# Patient Record
Sex: Female | Born: 1975 | Race: Black or African American | Hispanic: No | Marital: Single
Health system: Southern US, Community
[De-identification: ages and names within clinical notes are randomized; demographics above are authoritative.]

## PROBLEM LIST (undated history)

## (undated) ENCOUNTER — Encounter

## (undated) ENCOUNTER — Ambulatory Visit: Attending: Family | Primary: Family

## (undated) ENCOUNTER — Ambulatory Visit

## (undated) ENCOUNTER — Telehealth: Attending: Family | Primary: Family

## (undated) ENCOUNTER — Ambulatory Visit: Payer: PRIVATE HEALTH INSURANCE | Attending: Family | Primary: Family

## (undated) ENCOUNTER — Ambulatory Visit: Payer: PRIVATE HEALTH INSURANCE

## (undated) ENCOUNTER — Encounter: Attending: Family | Primary: Family

## (undated) ENCOUNTER — Telehealth

## (undated) ENCOUNTER — Ambulatory Visit
Payer: PRIVATE HEALTH INSURANCE | Attending: Physical Medicine & Rehabilitation | Primary: Physical Medicine & Rehabilitation

## (undated) ENCOUNTER — Institutional Professional Consult (permissible substitution)

## (undated) ENCOUNTER — Ambulatory Visit: Payer: PRIVATE HEALTH INSURANCE | Attending: Physician Assistant | Primary: Physician Assistant

## (undated) ENCOUNTER — Ambulatory Visit
Payer: MEDICARE | Attending: Student in an Organized Health Care Education/Training Program | Primary: Student in an Organized Health Care Education/Training Program

## (undated) ENCOUNTER — Inpatient Hospital Stay: Payer: PRIVATE HEALTH INSURANCE

## (undated) ENCOUNTER — Ambulatory Visit: Payer: MEDICAID

## (undated) ENCOUNTER — Encounter: Attending: Infectious Disease | Primary: Infectious Disease

## (undated) ENCOUNTER — Ambulatory Visit: Payer: PRIVATE HEALTH INSURANCE | Attending: Orthopaedic Surgery | Primary: Orthopaedic Surgery

## (undated) ENCOUNTER — Ambulatory Visit
Payer: PRIVATE HEALTH INSURANCE | Attending: Student in an Organized Health Care Education/Training Program | Primary: Student in an Organized Health Care Education/Training Program

## (undated) HISTORY — PX: CHOLECYSTECTOMY: SHX55

## (undated) MED ORDER — ZINC GLUCONATE 50 MG TABLET: Freq: Every day | ORAL | 0 days

## (undated) MED ORDER — ERGOCALCIFEROL (VITAMIN D2) 1,250 MCG (50,000 UNIT) CAPSULE: ORAL | 0 days

## (undated) MED ORDER — ELDERBERRY FRUIT 460 MG-ELDERBERRY FLOWER 115 MG CAPSULE: ORAL | 0 days

## (undated) MED ORDER — VITAMIN D3 ORAL: ORAL | 0 days

## (undated) MED ORDER — CALCIUM CARBONATE 600 MG CALCIUM (1,500 MG) TABLET: Freq: Every day | ORAL | 0 days

## (undated) MED ORDER — ASCORBIC ACID (VITAMIN C) 1,000 MG TABLET: Freq: Every day | ORAL | 0 days

---
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
## 2017-04-12 ENCOUNTER — Ambulatory Visit
Admission: RE | Admit: 2017-04-12 | Discharge: 2017-04-12 | Payer: Commercial Managed Care - PPO | Attending: Specialist | Admitting: Specialist

## 2017-04-12 DIAGNOSIS — Z202 Contact with and (suspected) exposure to infections with a predominantly sexual mode of transmission: Secondary | ICD-10-CM

## 2017-04-12 DIAGNOSIS — R87618 Other abnormal cytological findings on specimens from cervix uteri: Secondary | ICD-10-CM

## 2017-04-12 DIAGNOSIS — Z6841 Body Mass Index (BMI) 40.0 and over, adult: Principal | ICD-10-CM

## 2018-05-25 MED ORDER — ESCITALOPRAM 20 MG TABLET
ORAL_TABLET | Freq: Every day | ORAL | 0 refills | 0.00000 days | Status: CP
Start: 2018-05-25 — End: 2018-11-24

## 2018-11-24 MED ORDER — ESCITALOPRAM 20 MG TABLET
ORAL_TABLET | 0 refills | 0 days | Status: CP
Start: 2018-11-24 — End: 2018-12-07

## 2018-12-07 MED ORDER — ESCITALOPRAM 20 MG TABLET
ORAL_TABLET | Freq: Every day | ORAL | 1 refills | 0.00000 days | Status: CP
Start: 2018-12-07 — End: ?

## 2018-12-07 MED ORDER — TRAZODONE 50 MG TABLET
ORAL_TABLET | Freq: Every evening | ORAL | 5 refills | 0.00000 days | Status: CP
Start: 2018-12-07 — End: 2019-01-06

## 2018-12-07 MED ORDER — ALPRAZOLAM 0.25 MG TABLET
ORAL_TABLET | Freq: Three times a day (TID) | ORAL | 1 refills | 0.00000 days | Status: CP | PRN
Start: 2018-12-07 — End: 2019-12-07

## 2018-12-12 ENCOUNTER — Ambulatory Visit: Admit: 2018-12-12 | Discharge: 2018-12-13

## 2018-12-12 DIAGNOSIS — N3000 Acute cystitis without hematuria: Principal | ICD-10-CM

## 2018-12-14 MED ORDER — SULFAMETHOXAZOLE 800 MG-TRIMETHOPRIM 160 MG TABLET
ORAL_TABLET | Freq: Two times a day (BID) | ORAL | 0 refills | 0 days | Status: CP
Start: 2018-12-14 — End: 2018-12-19

## 2019-01-25 ENCOUNTER — Emergency Department
Admission: EM | Admit: 2019-01-25 | Discharge: 2019-01-26 | Disposition: A | Discharge status: Home / Self Care | Payer: Self-pay | Attending: Student in an Organized Health Care Education/Training Program | Admitting: Student in an Organized Health Care Education/Training Program

## 2019-01-25 ENCOUNTER — Emergency Department: Payer: Self-pay

## 2019-01-25 ENCOUNTER — Other Ambulatory Visit: Payer: Self-pay

## 2019-01-25 DIAGNOSIS — R002 Palpitations: Secondary | ICD-10-CM | POA: Insufficient documentation

## 2019-01-25 DIAGNOSIS — R000 Tachycardia, unspecified: Secondary | ICD-10-CM | POA: Insufficient documentation

## 2019-01-25 LAB — COMPREHENSIVE METABOLIC PANEL
ALT: 19 U/L (ref 0–44)
AST: 25 U/L (ref 15–41)
Albumin: 3.8 g/dL (ref 3.5–5.0)
Alkaline Phosphatase: 45 U/L (ref 38–126)
Anion gap: 8 (ref 5–15)
BUN: 12 mg/dL (ref 6–20)
CO2: 23 mmol/L (ref 22–32)
Calcium: 8.3 mg/dL — ABNORMAL LOW (ref 8.9–10.3)
Chloride: 106 mmol/L (ref 98–111)
Creatinine, Ser: 0.76 mg/dL (ref 0.44–1.00)
GFR calc Af Amer: >60 mL/min (ref >60)
GFR calc non Af Amer: >60 mL/min (ref >60)
Glucose, Bld: 133 mg/dL — ABNORMAL HIGH (ref 70–99)
Potassium: 3.1 mmol/L — ABNORMAL LOW (ref 3.5–5.1)
Sodium: 137 mmol/L (ref 135–145)
Total Bilirubin: 0.5 mg/dL (ref 0.3–1.2)
Total Protein: 7 g/dL (ref 6.5–8.1)

## 2019-01-25 LAB — CBC WITH DIFFERENTIAL/PLATELET
Abs Immature Granulocytes: 0.03 10*3/uL (ref 0.00–0.07)
Basophils Absolute: 0 10*3/uL (ref 0.0–0.1)
Basophils Relative: 0 %
Eosinophils Absolute: 0.2 10*3/uL (ref 0.0–0.5)
Eosinophils Relative: 2 %
HCT: 35.2 % — ABNORMAL LOW (ref 36.0–46.0)
Hemoglobin: 11.3 g/dL — ABNORMAL LOW (ref 12.0–15.0)
Immature Granulocytes: 0 %
Lymphocytes Relative: 42 %
Lymphs Abs: 3.1 10*3/uL (ref 0.7–4.0)
MCH: 25.9 pg — ABNORMAL LOW (ref 26.0–34.0)
MCHC: 32.1 g/dL (ref 30.0–36.0)
MCV: 80.5 fL (ref 80.0–100.0)
Monocytes Absolute: 0.7 10*3/uL (ref 0.1–1.0)
Monocytes Relative: 10 %
Neutro Abs: 3.4 10*3/uL (ref 1.7–7.7)
Neutrophils Relative %: 46 %
Platelets: 256 10*3/uL (ref 150–400)
RBC: 4.37 MIL/uL (ref 3.87–5.11)
RDW: 14.3 % (ref 11.5–15.5)
WBC: 7.4 10*3/uL (ref 4.0–10.5)
nRBC: 0 % (ref 0.0–0.2)

## 2019-01-25 LAB — FIBRIN DERIVATIVES D-DIMER (ARMC ONLY): Fibrin derivatives D-dimer (ARMC): 330.41 ng{FEU}/mL (ref 0.00–499.00)

## 2019-01-25 MED ORDER — LORAZEPAM 1 MG PO TABS
1.0000 mg | ORAL_TABLET | Freq: Once | ORAL | Status: AC
  Administered 2019-01-25: 23:00:00 1 mg via ORAL
  Filled 2019-01-25: qty 1

## 2019-01-25 NOTE — ED Triage Notes (Signed)
Pt arrives to ED via [facility] with c/c of chest tightness and tachycardia. Pt took "small amount" of CBD oil and felt the Sx onset shortly after. 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 EMS reports initial vitals of p165, 123/83, 98% on room air. Pt given 6mg  adenosine with subsequent pulse of 120. 20G place in left AC. Pt given 566mL NS bolus. Upon arrival, pt A&Ox4, pt anxious, no respiratory Sx evident. Dr Quentin Cornwall at bedside.

## 2019-01-25 NOTE — ED Provider Notes (Signed)
[facility] Emergency Department Provider Note    First MD Initiated Contact with Patient 01/25/19 2310     (approximate)  I have reviewed the triage vital signs and the nursing notes.   HISTORY  Chief Complaint Tachycardia    HPI Stacy Stevens is a 43 y.o. female   no significant past medical history presents the ER for evaluation of "SVT".  Patient states that she was trying CBD oil droplet this evening for the first time and immediately after started feeling chest palpitations associated with shortness of breath.  States that she immediately knew that she was" SVT.  "However she is never been diagnosed with SVT.  Has had Holter monitors done before.  Denies any recent prolonged travel.  No history of blood clots.  Denies any history of coronary disease.  No history of hypertension or thyroid disease.  States that she still feels very jittery and anxious.  She was given adenosine in route however review of the tracings appeared to show a sinus tachycardia.   History reviewed. No pertinent past medical history. No family history on file. Past Surgical History:  Procedure Laterality Date  . CHOLECYSTECTOMY     There are no active problems to display for this patient.     Prior to Admission medications   Not on File    Allergies Patient has no allergy information on record.    Social History Social History   Tobacco Use  . Smoking status: Not on file  Substance Use Topics  . Alcohol use: Not on file  . Drug use: Not on file    Review of Systems Patient denies headaches, rhinorrhea, blurry vision, numbness, shortness of breath, chest pain, edema, cough, abdominal pain, nausea, vomiting, diarrhea, dysuria, fevers, rashes or hallucinations unless otherwise stated above in HPI. ____________________________________________   PHYSICAL EXAM:  VITAL SIGNS: Vitals:   01/26/19 0130 01/26/19 0145  BP: 109/69   Pulse: 92 95  Resp:    SpO2: 97%  99%    Constitutional: Alert and oriented. anxious  Eyes: Conjunctivae are normal.  Head: Atraumatic. Nose: No congestion/rhinnorhea. Mouth/Throat: Mucous membranes are moist.   Neck: No stridor. Painless ROM.  Cardiovascular: Normal rate, regular rhythm. Grossly normal heart sounds.  Good peripheral circulation. Respiratory: Normal respiratory effort.  No retractions. Lungs CTAB. Gastrointestinal: Soft and nontender. No distention. No abdominal bruits. No CVA tenderness. Genitourinary: deferred Musculoskeletal: No lower extremity tenderness nor edema.  No joint effusions. Neurologic:  Normal speech and language. No gross focal neurologic deficits are appreciated. No facial droop Skin:  Skin is warm, dry and intact. No rash noted. Psychiatric: Mood and affect are normal. Speech and behavior are normal.  ____________________________________________   LABS (all labs ordered are listed, but only abnormal results are displayed)  Results for orders placed or performed during the hospital encounter of 01/25/19 (from the past 24 hour(s))  CBC with Differential/Platelet     Status: Abnormal   Collection Time: 01/25/19 11:19 PM  Result Value Ref Range   WBC 7.4 4.0 - 10.5 K/uL   RBC 4.37 3.87 - 5.11 MIL/uL   Hemoglobin 11.3 (L) 12.0 - 15.0 g/dL   HCT 35.2 (L) 36.0 - 46.0 %   MCV 80.5 80.0 - 100.0 fL   MCH 25.9 (L) 26.0 - 34.0 pg   MCHC 32.1 30.0 - 36.0 g/dL   RDW 14.3 11.5 - 15.5 %   Platelets 256 150 - 400 K/uL   nRBC 0.0 0.0 - 0.2 %  Neutrophils Relative % 46 %   Neutro Abs 3.4 1.7 - 7.7 K/uL   Lymphocytes Relative 42 %   Lymphs Abs 3.1 0.7 - 4.0 K/uL   Monocytes Relative 10 %   Monocytes Absolute 0.7 0.1 - 1.0 K/uL   Eosinophils Relative 2 %   Eosinophils Absolute 0.2 0.0 - 0.5 K/uL   Basophils Relative 0 %   Basophils Absolute 0.0 0.0 - 0.1 K/uL   Immature Granulocytes 0 %   Abs Immature Granulocytes 0.03 0.00 - 0.07 K/uL  Comprehensive metabolic panel     Status:  Abnormal   Collection Time: 01/25/19 11:19 PM  Result Value Ref Range   Sodium 137 135 - 145 mmol/L   Potassium 3.1 (L) 3.5 - 5.1 mmol/L   Chloride 106 98 - 111 mmol/L   CO2 23 22 - 32 mmol/L   Glucose, Bld 133 (H) 70 - 99 mg/dL   BUN 12 6 - 20 mg/dL   Creatinine, Ser 9.600.76 0.44 - 1.00 mg/dL   Calcium 8.3 (L) 8.9 - 10.3 mg/dL   Total Protein 7.0 6.5 - 8.1 g/dL   Albumin 3.8 3.5 - 5.0 g/dL   AST 25 15 - 41 U/L   ALT 19 0 - 44 U/L   Alkaline Phosphatase 45 38 - 126 U/L   Total Bilirubin 0.5 0.3 - 1.2 mg/dL   GFR calc non Af Amer >60 >60 mL/min   GFR calc Af Amer >60 >60 mL/min   Anion gap 8 5 - 15  Troponin I (High Sensitivity)     Status: None   Collection Time: 01/25/19 11:19 PM  Result Value Ref Range   Troponin I (High Sensitivity) 3 <18 ng/L  Fibrin derivatives D-Dimer ([facility] only)     Status: None   Collection Time: 01/25/19 11:19 PM  Result Value Ref Range   Fibrin derivatives D-dimer ([facility]) 330.41 0.00 - 499.00 ng/mL (FEU)  Troponin I (High Sensitivity)     Status: None   Collection Time: 01/26/19  1:23 AM  Result Value Ref Range   Troponin I (High Sensitivity) 3 <18 ng/L   ____________________________________________  EKG My review and personal interpretation at Time: 21:38   Indication: palpiations  Rate: 105  Rhythm: sinus Axis: normal Other: normal intervals, no stemi, nonspecific st and t wave abnml ____________________________________________  RADIOLOGY  I personally reviewed all radiographic images ordered to evaluate for the above acute complaints and reviewed radiology reports and findings.  These findings were personally discussed with the patient.  Please see medical record for radiology report.  ____________________________________________   PROCEDURES  Procedure(s) performed:  Procedures    Critical Care performed: no ____________________________________________   INITIAL IMPRESSION / ASSESSMENT AND PLAN / ED COURSE  Pertinent labs &  imaging results that were available during my care of the patient were reviewed by me and considered in my medical decision making (see chart for details).   DDX: Dysrhythmia, ACS, PE, dehydration, medication effect, anxiety, panic attack, electrolyte abnormality  Stacy Stevens is a 43 y.o. who presents to the ED with symptoms as described above.  She is anxious appearing but in no acute distress.  I have low suspicion for ACS but will evaluate with serial enzymes.  She is low risk by Wells criteria will order d-dimer to further stratify for PE.  No signs of dysrhythmia on twelve-lead.  EMS strip shows sinus tachycardia.  Will place on cardiac monitor observe in the ER.  Clinical Course as of Jan 25 201  Fri Jan 26, 2019  0156 Repeat troponin is negative.  Patient remains stable and appropriate for outpatient follow-up.  Suspect symptoms are related to ingestion of CBD oil inducing what sounds like a panic attack.  Have discussed with the patient and available family all diagnostics and treatments performed thus far and all questions were answered to the best of my ability. The patient demonstrates understanding and agreement with plan.    [PR]    Clinical Course User Index [PR] Willy Eddyobinson, Kaelea Gathright, MD    The patient was evaluated in Emergency Department today for the symptoms described in the history of present illness. He/she was evaluated in the context of the global COVID-19 pandemic, which necessitated consideration that the patient might be at risk for infection with the SARS-CoV-2 virus that causes COVID-19. Institutional protocols and algorithms that pertain to the evaluation of patients at risk for COVID-19 are in a state of rapid change based on information released by regulatory bodies including the CDC and federal and state organizations. These policies and algorithms were followed during the patient's care in the ED.  As part of my medical decision making, I reviewed the following data  within the electronic MEDICAL RECORD NUMBER Nursing notes reviewed and incorporated, Labs reviewed, notes from prior ED visits and [place] Controlled Substance Database   ____________________________________________   FINAL CLINICAL IMPRESSION(S) / ED DIAGNOSES  Final diagnoses:  Tachycardia  Palpitations      NEW MEDICATIONS STARTED DURING THIS VISIT:  New Prescriptions   No medications on file     Note:  This document was prepared using Dragon voice recognition software and may include unintentional dictation errors.    Willy Eddyobinson, Kye Silverstein, MD 01/26/19 [phone]

## 2019-01-26 LAB — TROPONIN I (HIGH SENSITIVITY)
Troponin I (High Sensitivity): 3 ng/L (ref <18)
Troponin I (High Sensitivity): 3 ng/L (ref <18)

## 2019-01-26 NOTE — ED Notes (Signed)
Pt states CP dec since arrival; pt asleep upon entrance to room; family remains with pt.

## 2019-03-29 DIAGNOSIS — F419 Anxiety disorder, unspecified: Secondary | ICD-10-CM

## 2019-06-21 MED ORDER — ESCITALOPRAM 20 MG TABLET
ORAL_TABLET | Freq: Every day | ORAL | 1 refills | 90.00000 days | Status: CP
Start: 2019-06-21 — End: ?

## 2019-06-21 MED ORDER — ALPRAZOLAM 0.25 MG TABLET
ORAL_TABLET | Freq: Three times a day (TID) | ORAL | 0 refills | 5 days | Status: CP | PRN
Start: 2019-06-21 — End: 2020-06-20

## 2019-06-26 MED ORDER — FLUOXETINE 40 MG CAPSULE
ORAL_CAPSULE | Freq: Every day | ORAL | 2 refills | 30.00000 days | Status: CP
Start: 2019-06-26 — End: ?

## 2019-10-19 ENCOUNTER — Ambulatory Visit
Admit: 2019-10-19 | Discharge: 2019-10-20 | Disposition: A | Discharge status: Home / Self Care | Payer: MEDICAID | Attending: Emergency Medicine

## 2019-10-19 DIAGNOSIS — R079 Chest pain, unspecified: Principal | ICD-10-CM

## 2019-10-19 MED ORDER — PANTOPRAZOLE 20 MG TABLET,DELAYED RELEASE
ORAL_TABLET | Freq: Every day | ORAL | 3 refills | 90 days | Status: CP
Start: 2019-10-19 — End: 2019-11-08

## 2019-10-19 MED ORDER — SUCRALFATE 100 MG/ML ORAL SUSPENSION
Freq: Four times a day (QID) | ORAL | 0 refills | 11.00000 days | Status: CP
Start: 2019-10-19 — End: 2019-11-18

## 2019-11-03 DIAGNOSIS — F419 Anxiety disorder, unspecified: Principal | ICD-10-CM

## 2019-11-06 MED ORDER — SUCRALFATE 1 GRAM TABLET
ORAL_TABLET | Freq: Four times a day (QID) | ORAL | 1 refills | 30 days | Status: CP
Start: 2019-11-06 — End: 2020-11-05

## 2020-07-03 DIAGNOSIS — F419 Anxiety disorder, unspecified: Principal | ICD-10-CM

## 2020-07-03 MED ORDER — FLUOXETINE 40 MG CAPSULE
ORAL_CAPSULE | 0 refills | 0 days | Status: CP
Start: 2020-07-03 — End: ?

## 2020-08-07 IMAGING — DX PORTABLE CHEST - 1 VIEW
1 series · 1 of 1 positions shown · non-contrast
Comparison: None

CLINICAL DATA: Shortness of breath, chest pressure

EXAM:
PORTABLE CHEST 1 VIEW

[chest ap]
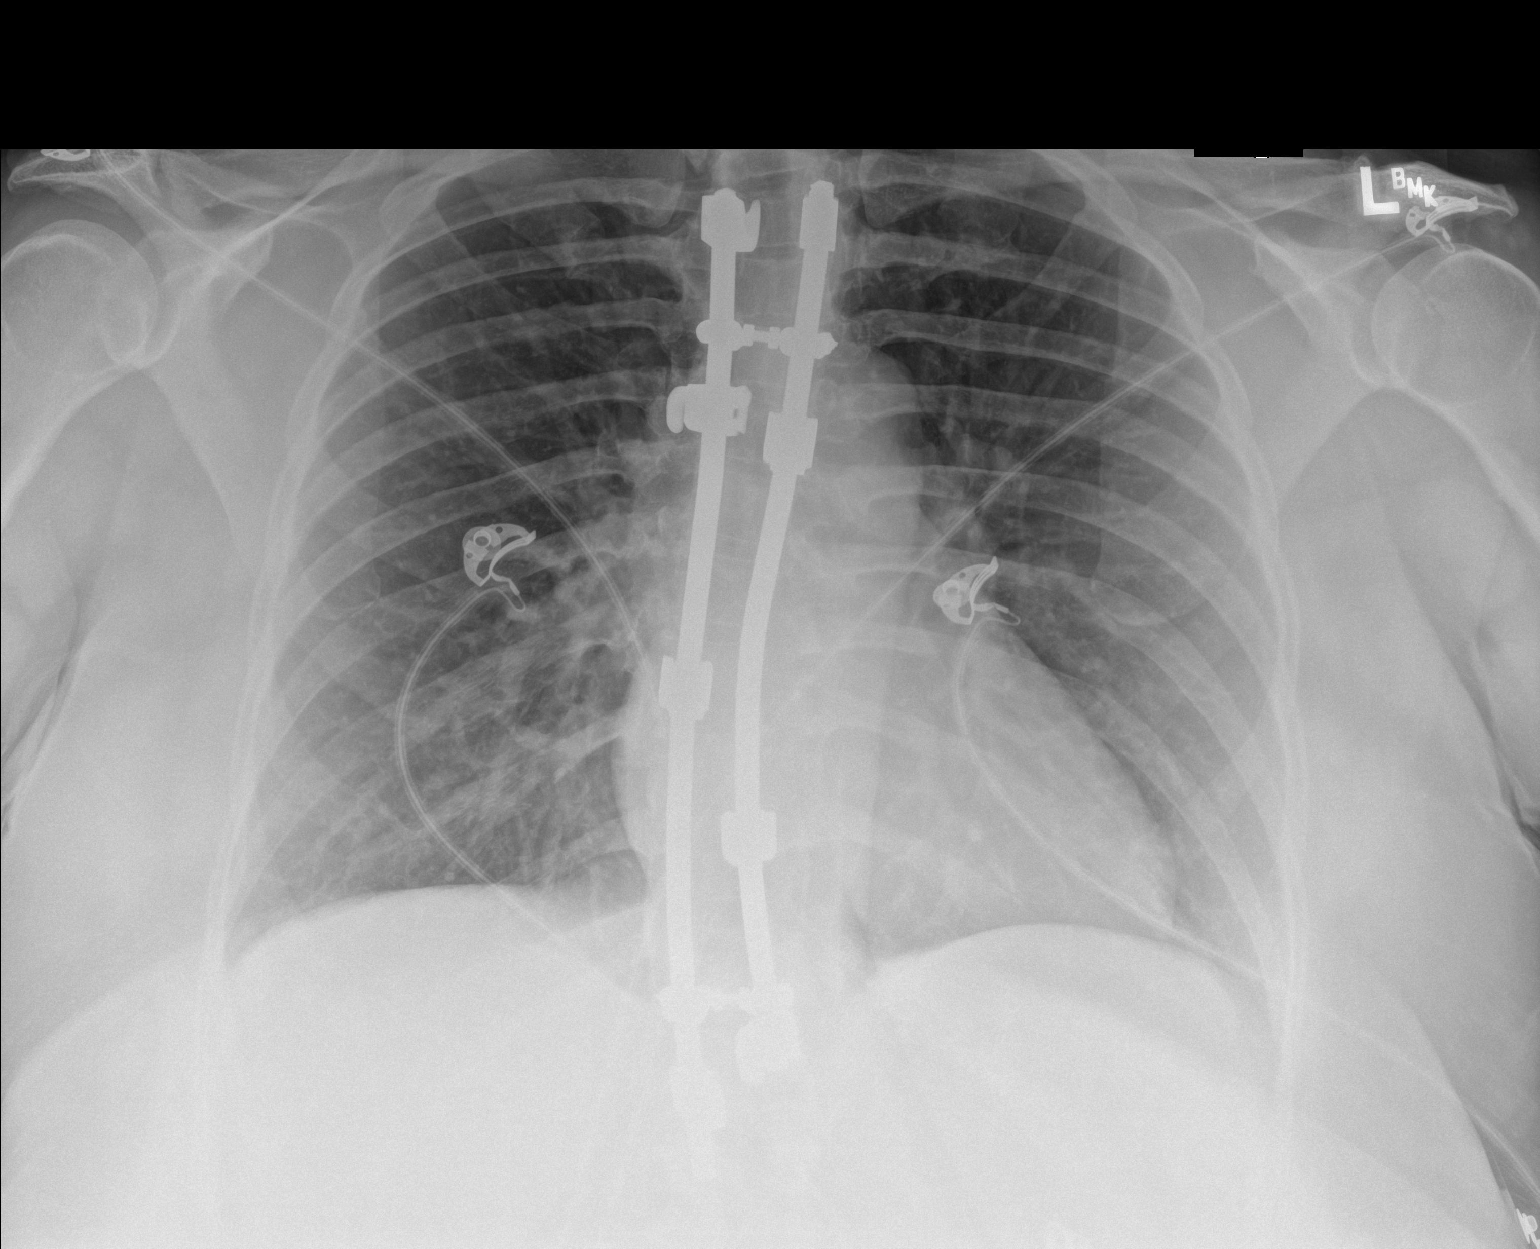

[1 of 1 positions shown; findings below may reference images not displayed]

FINDINGS: Heart and mediastinal contours are within normal limits. No focal
opacities or effusions. No acute bony abnormality. Posterior spinal
rods in place with mild right scoliosis.
IMPRESSION: No active cardiopulmonary disease.

## 2020-10-24 ENCOUNTER — Ambulatory Visit: Admit: 2020-10-24 | Discharge: 2020-10-25

## 2020-10-24 ENCOUNTER — Ambulatory Visit: Admit: 2020-10-24 | Discharge: 2020-10-25 | Attending: Family | Primary: Family

## 2020-10-24 DIAGNOSIS — R590 Localized enlarged lymph nodes: Principal | ICD-10-CM

## 2020-10-24 DIAGNOSIS — Z717 Human immunodeficiency virus [HIV] counseling: Principal | ICD-10-CM

## 2020-10-24 DIAGNOSIS — B20 Human immunodeficiency virus [HIV] disease: Principal | ICD-10-CM

## 2020-10-24 DIAGNOSIS — Z21 Asymptomatic human immunodeficiency virus [HIV] infection status: Principal | ICD-10-CM

## 2020-10-24 MED ORDER — BICTEGRAVIR 50 MG-EMTRICITABINE 200 MG-TENOFOVIR ALAFENAM 25 MG TABLET
ORAL_TABLET | Freq: Every day | ORAL | 11 refills | 30.00000 days | Status: CP
Start: 2020-10-24 — End: 2021-10-24
  Filled 2020-12-04: qty 30, 30d supply, fill #0

## 2020-10-28 ENCOUNTER — Encounter: Admit: 2020-10-28 | Discharge: 2020-10-28

## 2020-10-30 ENCOUNTER — Ambulatory Visit: Admit: 2020-10-30 | Discharge: 2020-10-31

## 2020-10-30 DIAGNOSIS — Z21 Asymptomatic human immunodeficiency virus [HIV] infection status: Principal | ICD-10-CM

## 2020-10-30 DIAGNOSIS — R590 Localized enlarged lymph nodes: Principal | ICD-10-CM

## 2020-10-30 DIAGNOSIS — B20 Human immunodeficiency virus [HIV] disease: Principal | ICD-10-CM

## 2020-10-30 DIAGNOSIS — Z717 Human immunodeficiency virus [HIV] counseling: Principal | ICD-10-CM

## 2020-11-24 ENCOUNTER — Ambulatory Visit: Admit: 2020-11-24 | Discharge: 2020-11-25

## 2020-11-24 ENCOUNTER — Ambulatory Visit: Admit: 2020-11-24 | Discharge: 2020-11-25 | Attending: Family | Primary: Family

## 2020-11-24 DIAGNOSIS — Z21 Asymptomatic human immunodeficiency virus [HIV] infection status: Principal | ICD-10-CM

## 2020-11-24 DIAGNOSIS — Z79899 Other long term (current) drug therapy: Principal | ICD-10-CM

## 2020-11-24 DIAGNOSIS — Z717 Human immunodeficiency virus [HIV] counseling: Principal | ICD-10-CM

## 2020-11-24 DIAGNOSIS — B259 Cytomegaloviral disease, unspecified: Principal | ICD-10-CM

## 2020-11-24 DIAGNOSIS — B20 Human immunodeficiency virus [HIV] disease: Principal | ICD-10-CM

## 2020-11-24 MED ORDER — BICTEGRAVIR 50 MG-EMTRICITABINE 200 MG-TENOFOVIR ALAFENAM 25 MG TABLET
ORAL_TABLET | Freq: Every day | ORAL | 0 refills | 14 days
Start: 2020-11-24 — End: 2020-12-08

## 2020-12-04 NOTE — Unmapped (Signed)
[facility] Specialty Medication Onboarding    Specialty Medication: Radio producer  Prior Authorization: Not Required   Financial Assistance: Yes - manufacture assistance approved via copay card or RX [employer]UGS (1102 bucket) as primary   Final Copay/Day Supply: $0 / 30    Insurance Restrictions: None     Notes to Pharmacist: n/a    The triage team has completed the benefits investigation and has determined that the patient is able to fill this medication at [facility]. Please contact the patient to complete the onboarding or follow up with the prescribing physician as needed.

## 2020-12-04 NOTE — Unmapped (Addendum)
[facility]   Patient Onboarding/Medication Counseling    Shelley Beck is a 45 y.o. female with HIV who I am counseling today on continuation of therapy.  I am speaking to the patient.    Was a Nurse, learning disability used for this call? No    Verified patient's date of birth / HIPAA.    Specialty medication(s) to be sent: Infectious Disease: Biktarvy      Non-specialty medications/supplies to be sent: n/a      Medications not needed at this time: n/a         Biktarvy (bictegravir, emtricitabine, and tenofovir alafenamide)    The patient declined counseling on medication administration, missed dose instructions, goals of therapy, side effects and monitoring parameters, warnings and precautions, drug/food interactions and storage, handling precautions, and disposal because they have taken the medication previously. The information in the declined sections below are for informational purposes only and was not discussed with patient.       Medication & Administration     Dosage: Take 1 tablet by mouth daily    Administration: Take without regard to food    Adherence/Missed dose instructions: take missed dose as soon as you remember. If it is close to the time of your next dose, skip the dose and resume with your next scheduled dose.    Goals of Therapy     To suppress viral replication and keep patient's HIV undetectable by lab tests    Side Effects & Monitoring Parameters     Common Side Effects:  ??? Diarrhea  ??? Upset stomach  ??? Headache  ??? Changes in Weight  ??? Changes in mood    The following side effects should be reported to the provider:     ?? If patient experiences: signs of an allergic reaction (rash; hives; itching; red, swollen, blistered, or peeling skin with or without fever; wheezing; tightness in the chest or throat; trouble breathing, swallowing, or talking; unusual hoarseness; or swelling of the mouth, face, lips, tongue, or throat)  ?? signs of kidney problems (unable to pass urine, change in how much urine is passed, blood in the urine, or a big weight gain)  ?? signs of liver problems (dark urine, feeling tired, not hungry, upset stomach or stomach pain, light-colored stools, throwing up, or yellow skin or eyes)  ?? signs of lactic acidosis (fast breathing, fast heartbeat, a heartbeat that does not feel normal, very bad upset stomach or throwing up, feeling very sleepy, shortness of breath, feeling very tired or weak, very bad dizziness, feeling cold, or muscle pain or cramps)  Weight gain: some patients have reported weight gain after starting this medication. The amount of weight can vary.    Monitoring Parameters:  ?? CD4  Count  ?? HIV RNA plasma levels,  ?? Liver function  ?? Total bilirubin  ?? serum creatinine  ?? urine glucose  ?? urine protein (prior to or when initiating therapy and as clinically indicated during therapy);       Drug/Food Interactions     ??? Medication list reviewed in Epic. The patient was instructed to inform the care team before taking any new medications or supplements.   ??? Drug interaction(s).    o Metformin:  Biktarvy increases levels of metformin. Monitor for increased side effects from metfomin and for symptoms of lactic acidosis such as fast breathing, fast heartbeat, a heartbeat that does not feel normal, very bad upset stomach or throwing up, feeling very sleepy, shortness of breath,  feeling very tired or weak, very bad dizziness, feeling cold, or muscle pain or cramps. She has been taking both metformin and Biktarvy with no adverse effects.  o Calcium and zinc: I told her to either take at the same time with Biktarvy with food or to take the Biktarvy 2 hours before or 6 hours after these supplements  ??? Iron Preparations: May decrease the serum concentration of Biktarvy. If taken with food, Biktarvy can be administered with Ferrous sulfate. If taken on an empty stomach, Biktarvy must be taken 2 hours before ferrous sulfate. Avoid other iron salts.    Contraindications, Warnings, & Precautions     ??? Black Box Warning: Severe acute exacertbations of HBV have been reported in patients coinfected with HIV-1 and HBV fllowing discontinuation of therapy  ??? Coadministration with dofetilide, rifampin is contraindicated  ??? Immune reconstitution syndrome: Patients may develop immune reconstitution syndrome, resulting in the occurrence of an inflammatory response to an indolent or residual opportunistic infection or activation of autoimmune disorders (eg, Graves disease, polymyositis, Guillain-Barr?? syndrome, autoimmune hepatitis)   ??? Lactic acidosis/hepatomegaly  ??? Renal toxicity: patients with preexisting renal impairment and those taking nephrotoxic agents (including NSAIDs) are at increased risk.     Storage, Handling Precautions, & Disposal     ??? Store in the original container at room temperature.   ??? Keep lid tightly closed.   ??? Store in a dry place. Do not store in a bathroom.   ??? Keep all drugs in a safe place. Keep all drugs out of the reach of children and pets.   ??? Throw away unused or expired drugs. Do not flush down a toilet or pour down a drain unless you are told to do so. Check with your pharmacist if you have questions about the best way to throw out drugs. There may be drug take-back programs in your area.        Current Medications (including OTC/herbals), Comorbidities and Allergies     Current Outpatient Medications   Medication Sig Dispense Refill   ??? ascorbic acid, vitamin C, (VITAMIN C) 1000 MG tablet Take 2,000 mg by mouth in the morning.     ??? bictegrav-emtricit-tenofov ala (BIKTARVY) 50-200-25 mg tablet Take 1 tablet by mouth daily. 30 tablet 11   ??? bictegrav-emtricit-tenofov ala (BIKTARVY) 50-200-25 mg tablet Take 1 tablet by mouth in the morning for 14 days. 14 tablet 0   ??? calcium carbonate (OS-CAL) 1,500 mg (600 mg elem calcium) tablet Take 1 tablet by mouth in the morning.     ??? cholecalciferol, vitamin D3, (VITAMIN D3 ORAL) Take by mouth.     ??? elderberry fruit and flower 460-115 mg cap Take by mouth.     ??? ergocalciferol-1,250 mcg, 50,000 unit, (VITAMIN D2-1,250 MCG, 50,000 UNIT,) 1,250 mcg (50,000 unit) capsule Take 1,250 mcg by mouth once a week.     ??? escitalopram oxalate (LEXAPRO) 20 MG tablet Take 1 tablet (20 mg total) by mouth daily. 90 tablet 1   ??? fluticasone (FLONASE) 50 mcg/actuation nasal spray 2 sprays by Each Nare route daily. USE FOR 10-12 DAYS 16 g 0   ??? hydrOXYzine (ATARAX) 25 MG tablet TAKE 1 TO 2 TABLETS BY MOUTH THREE TIMES DAILY AS NEEDED FOR ANXIETY     ??? metFORMIN (GLUCOPHAGE-XR) 750 MG 24 hr tablet TAKE 1 TABLET BY MOUTH IN THE MORNING     ??? ondansetron (ZOFRAN-ODT) 4 MG disintegrating tablet PLACE 1 TABLET ON THE TONGUE AND ALLOW TO DISSOLVE EVERY  8 HOURS AS NEEDED     ??? phentermine 37.5 MG capsule TAKE 1 CAPSULE BY MOUTH IN THE MORNING     ??? traZODone (DESYREL) 50 MG tablet Take 1 tablet (50 mg total) by mouth nightly. 30 tablet 5   ??? zinc gluconate 50 mg (7 mg elemental zinc) tablet Take 50 mg by mouth in the morning.       No current facility-administered medications for this visit.       No Known Allergies    Patient Active Problem List   Diagnosis   ??? Morbid (severe) obesity due to excess calories (CMS-HCC)   ??? Primary insomnia   ??? Anxiety       Reviewed and up to date in Epic.    Appropriateness of Therapy     Acute infections noted within Epic:  No active infections  Patient reported infection: None    Is medication and dose appropriate based on diagnosis and infection status? Yes    Prescription has been clinically reviewed: Yes      Baseline Quality of Life Assessment      How many days over the past month did your HIV  keep you from your normal activities? For example, brushing your teeth or getting up in the morning. 0    Financial Information     Medication Assistance provided: Manufacturer Assistance    Anticipated copay of $0.00 reviewed with patient. Verified delivery address.    Delivery Information     Scheduled delivery date: 12/05/20    Expected start date: continuation of current treatment    Medication will be delivered via Next Day Courier to the prescription address in [facility].  This shipment will not require a signature.      Explained the services we provide at [facility] and that each month we would call to set up refills.  Stressed importance of returning phone calls so that we could ensure they receive their medications in time each month.  Informed patient that we should be setting up refills 7-10 days prior to when they will run out of medication.  A pharmacist will reach out to perform a clinical assessment periodically.  Informed patient that a welcome packet, containing information about our pharmacy and other support services, a Notice of Privacy Practices, and a drug information handout will be sent.      The patient or caregiver noted above participated in the development of this care plan and knows that they can request review of or adjustments to the care plan at any time.      Patient or caregiver verbalized understanding of the above information as well as how to contact the pharmacy at [phone] option 4 with any questions/concerns.  The pharmacy is open Monday through Friday 8:30am-4:30pm.  A pharmacist is available 24/7 via pager to answer any clinical questions they may have.    Patient Specific Needs     - Does the patient have any physical, cognitive, or cultural barriers? No    - Does the patient have adequate living arrangements? (i.e. the ability to store and take their medication appropriately) Yes    - Did you identify any home environmental safety or security hazards? No    - Patient prefers to have medications discussed with  Patient     - Is the patient or caregiver able to read and understand education materials at a high school level or above? Yes    - Patient's primary language is  English     - Is the patient high risk? No    - Does the patient require physician intervention or other additional services (i.e. dietary/nutrition, smoking cessation, social work)? No      Roderic Palau  [facility] Specialty Pharmacist

## 2021-01-13 NOTE — Unmapped (Signed)
The [facility] has made a third and final attempt to reach this patient to refill the following medication: Biktarvy.      We have been unable to leave messages on the following phone numbers: [phone] and have sent a MyChart message.    Dates contacted: 7/14, 7/20, 7/26  Last scheduled delivery: shipped 6/16    The patient may be at risk of non-compliance with this medication. The patient should call the [facility] at [phone] (option 4) to refill medication.    Shelley Beck   [facility] Specialty Technician

## 2021-01-15 NOTE — Unmapped (Signed)
I have been able to reach pt this pm. I have provided her with the contact # for [facility]. She reports she will call this pm.

## 2021-01-16 NOTE — Unmapped (Signed)
[facility] Refill Coordination Note    Specialty Medication(s) to be Shipped:   Infectious Disease: Biktarvy    Other medication(s) to be shipped: No additional medications requested for fill at this time     Shelley Beck, DOB: 06-18-1976  Phone: [phone] (home)       All above HIPAA information was verified with patient.     Was a Nurse, learning disability used for this call? No    Completed refill call assessment today to schedule patient's medication shipment from the [facility] [phone]).  All relevant notes have been reviewed.     Specialty medication(s) and dose(s) confirmed: Regimen is correct and unchanged.   Changes to medications: Amie reports no changes at this time.  Changes to insurance: No  New side effects reported not previously addressed with a pharmacist or physician: None reported  Questions for the pharmacist: No    Confirmed patient received a Conservation officer, historic buildings and a Surveyor, mining with first shipment. The patient will receive a drug information handout for each medication shipped and additional FDA Medication Guides as required.       DISEASE/MEDICATION-SPECIFIC INFORMATION        N/A    SPECIALTY MEDICATION ADHERENCE     Medication Adherence    Patient reported X missed doses in the last month: 0  Specialty Medication: BIKTARVY  Patient is on additional specialty medications: No              Were doses missed due to medication being on hold? No    Biktarvy 5 days worth of medication on hand.        REFERRAL TO PHARMACIST     Referral to the pharmacist: Not needed      [facility]     Shipping address confirmed in Epic.     Delivery Scheduled: Yes, Expected medication delivery date: 01/19/21.     Medication will be delivered via Same Day Courier to the prescription address in Epic W[facility].    [name]   [facility] Specialty Technician

## 2021-01-19 MED FILL — BIKTARVY 50 MG-200 MG-25 MG TABLET: ORAL | 30 days supply | Qty: 30 | Fill #1

## 2021-02-24 NOTE — Unmapped (Signed)
The [facility] has made a third and final attempt to reach this patient to refill the following medication: Biktarvy.      We have been unable to leave messages on the following phone numbers: [phone] and [phone] and have sent a MyChart message.    Dates contacted: 8/24, 8/30, 9/6  Last scheduled delivery: shipped 8/1    The patient may be at risk of non-compliance with this medication. The patient should call the [facility] at [phone]  Option 4, then Option 2 (all other specialty patients) to refill medication.    [name]   [facility] Specialty Technician

## 2021-02-24 NOTE — Unmapped (Signed)
I have called and left message for Oneta Rack- Mother [phone] (ROI) - to contact [facility] and ask for Selena Batten.

## 2021-02-25 NOTE — Unmapped (Signed)
I have called and left message for return call on number provided.

## 2021-02-25 NOTE — Unmapped (Signed)
I have received a call back from pt's mother. Pt has a different phone number [phone] - pt does work nights, may have to leave a message.

## 2021-02-26 NOTE — Unmapped (Signed)
I have called and left message for clarification of the number [phone]. I am unsure at this time if it is the mother, Shelley Beck or the patient, Shelley Beck.

## 2021-02-27 ENCOUNTER — Institutional Professional Consult (permissible substitution): Admit: 2021-02-27 | Discharge: 2021-02-28 | Attending: Family | Primary: Family

## 2021-02-27 DIAGNOSIS — B20 Human immunodeficiency virus [HIV] disease: Principal | ICD-10-CM

## 2021-02-27 NOTE — Unmapped (Signed)
[facility] Refill Coordination Note    Specialty Medication(s) to be Shipped:   Infectious Disease: Biktarvy    Other medication(s) to be shipped: No additional medications requested for fill at this time     Shelley Beck, DOB: 24-Apr-1976  Phone: [phone] (home)       All above HIPAA information was verified with patient.     Was a Nurse, learning disability used for this call? No    Completed refill call assessment today to schedule patient's medication shipment from the [facility] [phone]).  All relevant notes have been reviewed.     Specialty medication(s) and dose(s) confirmed: Regimen is correct and unchanged.   Changes to medications: Shelley Beck reports no changes at this time.  Changes to insurance: No  New side effects reported not previously addressed with a pharmacist or physician: None reported  Questions for the pharmacist: No    Confirmed patient received a Conservation officer, historic buildings and a Surveyor, mining with first shipment. The patient will receive a drug information handout for each medication shipped and additional FDA Medication Guides as required.       DISEASE/MEDICATION-SPECIFIC INFORMATION        N/A    SPECIALTY MEDICATION ADHERENCE     Medication Adherence    Patient reported X missed doses in the last month: 0  Specialty Medication: biktarvy  Patient is on additional specialty medications: No  Patient is on more than two specialty medications: No  Any gaps in refill history greater than 2 weeks in the last 3 months: no  Demonstrates understanding of importance of adherence: yes  Informant: patient              Were doses missed due to medication being on hold? No    Biktarvy 50-200-25mg : Patient has 3 days of medication on hand     REFERRAL TO PHARMACIST     Referral to the pharmacist: Not needed      [facility]     Shipping address confirmed in Epic.     Delivery Scheduled: Yes, Expected medication delivery date: 9/12.     Medication will be delivered via Same Day Courier to the prescription address in Epic WAM.    Olga Millers   [facility] Specialty Technician

## 2021-02-27 NOTE — Unmapped (Signed)
Patient picked up sample medication

## 2021-02-27 NOTE — Unmapped (Signed)
Discussed HIV biology, medication side effects, compliance, resistance, safe sex with condoms.  7 days Biktarvy samples provided.

## 2021-03-02 MED FILL — BIKTARVY 50 MG-200 MG-25 MG TABLET: ORAL | 30 days supply | Qty: 30 | Fill #2

## 2021-03-25 NOTE — Unmapped (Signed)
LM ON VM TO RETURN CALL TO RESCHEDULE.

## 2021-03-25 NOTE — Unmapped (Signed)
[facility] Refill Coordination Note    Specialty Medication(s) to be Shipped:   Infectious Disease: Biktarvy    Other medication(s) to be shipped: No additional medications requested for fill at this time     Shelley Beck, DOB: 1975-10-13  Phone: [phone] (home)       All above HIPAA information was verified with patient.     Was a Nurse, learning disability used for this call? No    Completed refill call assessment today to schedule patient's medication shipment from the [facility] [phone]).  All relevant notes have been reviewed.     Specialty medication(s) and dose(s) confirmed: Regimen is correct and unchanged.   Changes to medications: Shelley Beck reports no changes at this time.  Changes to insurance: No  New side effects reported not previously addressed with a pharmacist or physician: None reported  Questions for the pharmacist: No    Confirmed patient received a Conservation officer, historic buildings and a Surveyor, mining with first shipment. The patient will receive a drug information handout for each medication shipped and additional FDA Medication Guides as required.       DISEASE/MEDICATION-SPECIFIC INFORMATION        N/A    SPECIALTY MEDICATION ADHERENCE     Medication Adherence    Patient reported X missed doses in the last month: 0  Specialty Medication: BIKTARVY  Patient is on additional specialty medications: No              Were doses missed due to medication being on hold? No    Biktarvy 50-200-25 mg: 7 days of medicine on hand        REFERRAL TO PHARMACIST     Referral to the pharmacist: Not needed      [name]     Shipping address confirmed in Epic.     Delivery Scheduled: Yes, Expected medication delivery date: 03/31/21.     Medication will be delivered via Next Day Courier to the prescription address in Epic WAM.    [name]   [facility] Specialty Technician

## 2021-03-30 MED FILL — BIKTARVY 50 MG-200 MG-25 MG TABLET: ORAL | 30 days supply | Qty: 30 | Fill #3

## 2021-04-27 NOTE — Unmapped (Signed)
[facility] Clinical Assessment & Refill Coordination Note    Shelley Beck, [place]: Mar 14, 1976  Phone: [phone] (home)     All above HIPAA information was verified with patient's family member, Shelley Beck (domestic partner).     Was a Nurse, learning disability used for this call? No    Specialty Medication(s):   Infectious Disease: Biktarvy     Current Outpatient Medications   Medication Sig Dispense Refill   ??? ascorbic acid, vitamin C, (VITAMIN C) 1000 MG tablet Take 2,000 mg by mouth in the morning.     ??? bictegrav-emtricit-tenofov ala (BIKTARVY) 50-200-25 mg tablet Take 1 tablet by mouth daily. 30 tablet 11   ??? calcium carbonate (OS-CAL) 1,500 mg (600 mg elem calcium) tablet Take 1 tablet by mouth in the morning.     ??? cholecalciferol, vitamin D3, (VITAMIN D3 ORAL) Take by mouth.     ??? elderberry fruit and flower 460-115 mg cap Take by mouth.     ??? ergocalciferol-1,250 mcg, 50,000 unit, (VITAMIN D2-1,250 MCG, 50,000 UNIT,) 1,250 mcg (50,000 unit) capsule Take 1,250 mcg by mouth once a week.     ??? escitalopram oxalate (LEXAPRO) 20 MG tablet Take 1 tablet (20 mg total) by mouth daily. 90 tablet 1   ??? fluticasone (FLONASE) 50 mcg/actuation nasal spray 2 sprays by Each Nare route daily. USE FOR 10-12 DAYS 16 g 0   ??? hydrOXYzine (ATARAX) 25 MG tablet TAKE 1 TO 2 TABLETS BY MOUTH THREE TIMES DAILY AS NEEDED FOR ANXIETY     ??? metFORMIN (GLUCOPHAGE-XR) 750 MG 24 hr tablet TAKE 1 TABLET BY MOUTH IN THE MORNING     ??? ondansetron (ZOFRAN-ODT) 4 MG disintegrating tablet PLACE 1 TABLET ON THE TONGUE AND ALLOW TO DISSOLVE EVERY 8 HOURS AS NEEDED     ??? phentermine 37.5 MG capsule TAKE 1 CAPSULE BY MOUTH IN THE MORNING     ??? traZODone (DESYREL) 50 MG tablet Take 1 tablet (50 mg total) by mouth nightly. 30 tablet 5   ??? zinc gluconate 50 mg (7 mg elemental zinc) tablet Take 50 mg by mouth in the morning.       No current facility-administered medications for this visit.        Changes to medications: no changes    No Known Allergies    Changes to allergies: No    SPECIALTY MEDICATION ADHERENCE     Biktarvy 50-200-+25 mg: approximately 7 days of medicine on hand       Medication Adherence    Patient reported X missed doses in the last month: 0  Specialty Medication: Biktarvy 50-200-25mg   Patient is on additional specialty medications: No  Any gaps in refill history greater than 2 weeks in the last 3 months: no  Demonstrates understanding of importance of adherence: yes  Informant: significant other  Reliability of informant: reliable  Provider-estimated medication adherence level: good  Patient is at risk for Non-Adherence: No          Specialty medication(s) dose(s) confirmed: Regimen is correct and unchanged.     Are there any concerns with adherence? No    Adherence counseling provided? Not needed    CLINICAL MANAGEMENT AND INTERVENTION      Clinical Benefit Assessment:    Do you feel the medicine is effective or helping your condition? Yes    HIV ASSOCIATED LABS:     Lab Results   Component Value Date/Time    HIVRS Detected (A) 11/24/2020 12:22 PM  HIVRS Detected (A) 10/24/2020 11:17 AM    HIVCP 709 (H) 11/24/2020 12:22 PM    HIVCP 657,846 (H) 10/24/2020 11:17 AM    ACD4 525 11/24/2020 12:22 PM    ACD4 480 (L) 10/30/2020 02:06 PM       Clinical Benefit counseling provided? Labs from 11/24/20 show evidence of clinical benefit    Adverse Effects Assessment:    Are you experiencing any side effects? No    Are you experiencing difficulty administering your medicine? No    Quality of Life Assessment:    How many days over the past month did your HIV  keep you from your normal activities? For example, brushing your teeth or getting up in the morning. 0    Have you discussed this with your provider? Not needed    Acute Infection Status:    Acute infections noted within Epic:  No active infections  Patient reported infection: None    Therapy Appropriateness:    Is therapy appropriate and patient progressing towards therapeutic goals? Yes, therapy is appropriate and should be continued    DISEASE/MEDICATION-SPECIFIC INFORMATION      N/A    PATIENT SPECIFIC NEEDS     - Does the patient have any physical, cognitive, or cultural barriers? No    - Is the patient high risk? No    - Does the patient require a Care Management Plan? No     - Does the patient require physician intervention or other additional services (i.e. nutrition, smoking cessation, social work)? No      SHIPPING     Specialty Medication(s) to be Shipped:   Infectious Disease: Biktarvy    Other medication(s) to be shipped: No additional medications requested for fill at this time     Changes to insurance: No    Delivery Scheduled: Yes, Expected medication delivery date: 04/28/21.     Medication will be delivered via Same Day Courier to the confirmed prescription address in [facility].    The patient will receive a drug information handout for each medication shipped and additional FDA Medication Guides as required.  Verified that patient has previously received a Conservation officer, historic buildings and a Surveyor, mining.    The patient or caregiver noted above participated in the development of this care plan and knows that they can request review of or adjustments to the care plan at any time.      All of the patient's questions and concerns have been addressed.    Roderic Palau   [facility] Specialty Pharmacist

## 2021-04-28 MED FILL — BIKTARVY 50 MG-200 MG-25 MG TABLET: ORAL | 30 days supply | Qty: 30 | Fill #4

## 2021-05-27 NOTE — Unmapped (Signed)
[facility] Pharm[facility] Refill Coordination Note    Specialty Medication(s) to be Shipped:   Infectious Disease: Biktarvy    Other medication(s) to be shipped: No additional medications requested for fill at this time     Shelley Beck, DOB: 08-25-1975  Phone: [phone] (home)       All above HIPAA information was verified with patient's family member, finley.     Was a Nurse, learning disability used for this call? No    Completed refill call assessment today to schedule patient's medication shipment from the [facility] [phone]).  All relevant notes have been reviewed.     Specialty medication(s) and dose(s) confirmed: Regimen is correct and unchanged.   Changes to medications: Yahaira reports no changes at this time.  Changes to insurance: No  New side effects reported not previously addressed with a pharmacist or physician: None reported  Questions for the pharmacist: No    Confirmed patient received a Conservation officer, historic buildings and a Surveyor, mining with first shipment. The patient will receive a drug information handout for each medication shipped and additional FDA Medication Guides as required.       DISEASE/MEDICATION-SPECIFIC INFORMATION        N/A    SPECIALTY MEDICATION ADHERENCE     Medication Adherence    Patient reported X missed doses in the last month: 0  Specialty Medication: BIKTARVY              Were doses missed due to medication being on hold? No    Unable to confirm quantity on hand    REFERRAL TO PHARMACIST     Referral to the pharmacist: Not needed      [facility]     Shipping address confirmed in Epic.     Delivery Scheduled: Yes, Expected medication delivery date: 12/9.     Medication will be delivered via Next Day Courier to the prescription address in Epic WAM.    [name]   [facility] Specialty Technician

## 2021-05-28 MED FILL — BIKTARVY 50 MG-200 MG-25 MG TABLET: ORAL | 30 days supply | Qty: 30 | Fill #5

## 2021-07-02 NOTE — Unmapped (Signed)
The [facility] has made a second and final attempt to reach this patient to refill the following medication: Biktarvy.      We have left voicemail with mom at the following phone numbers: [phone], have been unable to leave messages on the following phone numbers: [phone]  and [phone] and have sent a MyChart message.    Dates contacted: 12/30, 1/11  Last scheduled delivery: shipped 12/8      The patient may be at risk of non-compliance with this medication. The patient should call the [facility] at [phone]  Option 4, then Option 2 (all other specialty patients) to refill medication.    [name]   [facility] Specialty Technician

## 2021-07-10 DIAGNOSIS — B349 Viral infection, unspecified: Principal | ICD-10-CM

## 2021-07-10 DIAGNOSIS — R0602 Shortness of breath: Principal | ICD-10-CM

## 2021-07-10 MED ORDER — ALBUTEROL SULFATE HFA 90 MCG/ACTUATION AEROSOL INHALER
RESPIRATORY_TRACT | 0 refills | 0 days | Status: CP | PRN
Start: 2021-07-10 — End: 2022-07-10

## 2021-07-11 ENCOUNTER — Ambulatory Visit: Admit: 2021-07-11 | Discharge: 2021-07-11 | Disposition: A | Discharge status: Home / Self Care | Payer: MEDICARE

## 2021-07-11 ENCOUNTER — Emergency Department: Admit: 2021-07-11 | Discharge: 2021-07-11 | Disposition: A | Discharge status: Home / Self Care | Payer: MEDICARE

## 2021-07-11 NOTE — Unmapped (Signed)
Emergency Department   ED Provider Note  Room: FAM/FAM    History     Chief Complaint:  Shortness of Breath     History provided by patient    HPI:  Keirah Konitzer is a 46 y.o. female who presents with a chief complaint of shortness of breath cough and fever (T-max of 102) and diarrhea for the past 6 days.  Patient has been taking Tylenol and ibuprofen with reduction of fever.  Patient denies any chest pain at this time.  Patient denies any nausea vomiting abdominal pain, blood in stool, changes in urinary habits, dizziness or headaches.  Patient had completed a Z-Pak that was provided by another physician.  Patient reports exacerbation of shortness of breath with exertion.  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    ROS: See HPI, all other systems reviewed and negative  Eyes: no conjunctivitis  ENT: no epistaxis  Cardiovascular:  no classic angina symptoms  Respiratory: no hemoptysis or pleurisy  GI: no vomiting or diarrhea  GU: no dysuria or frequency  Integumentary: no petechia  Allergy: no hives or symptoms of angioedema  Musculoskeletal: no unilateral leg swelling or calf pain  Neurological: no seizure    MEDICATIONS:   Patient's Medications   New Prescriptions    ALBUTEROL HFA 90 MCG/ACTUATION INHALER    Inhale 2 puffs every four (4) hours as needed for wheezing or shortness of breath.   Previous Medications    ASCORBIC ACID, VITAMIN C, (VITAMIN C) 1000 MG TABLET    Take 2,000 mg by mouth in the morning.    BICTEGRAV-EMTRICIT-TENOFOV ALA (BIKTARVY) 50-200-25 MG TABLET    Take 1 tablet by mouth daily.    CALCIUM CARBONATE (OS-CAL) 1,500 MG (600 MG ELEM CALCIUM) TABLET    Take 1 tablet by mouth in the morning.    CHOLECALCIFEROL, VITAMIN D3, (VITAMIN D3 ORAL)    Take by mouth.    ELDERBERRY FRUIT AND FLOWER 460-115 MG CAP    Take by mouth.    ERGOCALCIFEROL-1,250 MCG, 50,000 UNIT, (VITAMIN D2-1,250 MCG, 50,000 UNIT,) 1,250 MCG (50,000 UNIT) CAPSULE    Take 1,250 mcg by mouth once a week. ESCITALOPRAM OXALATE (LEXAPRO) 20 MG TABLET    Take 1 tablet (20 mg total) by mouth daily.    FLUTICASONE (FLONASE) 50 MCG/ACTUATION NASAL SPRAY    2 sprays by Each Nare route daily. USE FOR 10-12 DAYS    HYDROXYZINE (ATARAX) 25 MG TABLET    TAKE 1 TO 2 TABLETS BY MOUTH THREE TIMES DAILY AS NEEDED FOR ANXIETY    METFORMIN (GLUCOPHAGE-XR) 750 MG 24 HR TABLET    TAKE 1 TABLET BY MOUTH IN THE MORNING    ONDANSETRON (ZOFRAN-ODT) 4 MG DISINTEGRATING TABLET    PLACE 1 TABLET ON THE TONGUE AND ALLOW TO DISSOLVE EVERY 8 HOURS AS NEEDED    PHENTERMINE 37.5 MG CAPSULE    TAKE 1 CAPSULE BY MOUTH IN THE MORNING    TRAZODONE (DESYREL) 50 MG TABLET    Take 1 tablet (50 mg total) by mouth nightly.    ZINC GLUCONATE 50 MG (7 MG ELEMENTAL ZINC) TABLET    Take 50 mg by mouth in the morning.   Modified Medications    No medications on file   Discontinued Medications    No medications on file       ALLERGIES: No Known Allergies    PAST MEDICAL HISTORY:    Past Medical History:   Diagnosis Date   ???  Anxiety     managed on lexapro   ??? HIV disease (CMS-HCC)    ??? Neck pain        PAST SURGICAL HISTORY:   Past Surgical History:   Procedure Laterality Date   ??? CHOLECYSTECTOMY     ??? nsvd      X3   ??? scoliosis repair     ??? uterine ablation         SOCIAL HISTORY:   Social History     Tobacco Use   ??? Smoking status: Never   ??? Smokeless tobacco: Never   Substance Use Topics   ??? Alcohol use: Yes     Comment: socially       FAMILY HISTORY:    Family History   Problem Relation Age of Onset   ??? No Known Problems Mother    ??? No Known Problems Father          Physical Exam     Vitals:    07/10/21 2201   BP: 165/108   Pulse: 88   Resp: 20   Temp: 37.1 ??C (98.7 ??F)   SpO2: 99%     Reviewed vital signs and nursing note as charted by RN.    CONSTITUTIONAL: Alert and oriented and responds appropriately to questions. Well-appearing; well-nourished; in no acute distress  HEAD: Normocephalic; atraumatic  EYES: Extraocular movements intact; Conjunctivae clear, sclera non-icteric  ENT: Normal nose; no epistaxis; moist mucous membranes  NECK: Supple without meningismus; no JVD when sitting  CARD: Regular rhythm but tachycardic; no murmurs, no clicks, no rubs, no gallops; symmetric distal pulses  RESP: Normal chest excursion without splinting or tachypnea; breath sounds clear and equal bilaterally; no wheezes, no rhonchi, no rales  BACK:  The back appears normal  EXT: no cyanosis, no effusions, no edema  SKIN: Normal color for age and race; warm; dry; good turgor; no acute lesions noted  NEURO: Cranial nerves grossly intact, no slurred or dysarthric speech; Moves all extremities equally; Motor and sensory function intact  PSYCH: The patient's mood and manner are appropriate. Grooming and personal hygiene are appropriate    Results     Pertinent labs & imaging results that were available during my care of the patient were reviewed by me and considered in my medical decision making (see chart for details).    Labs Reviewed   INFLUENZA/RSV/COVID PCR     XR Chest 2 views    (Results Pending)     No results found for this visit on 07/10/21 (from the past 4464 hour(s)).    Medical Decision Making     Faten Frieson Riehle is a 46 y.o. female who presents with a chief complaint of shortness of breath cough diarrhea for the past 6 days as detailed above.  Vital signs show the patient is hypertensive but otherwise vital signs are within normal limits.  Physical exam is reassuring.  Diagnostics pending.  Disposition pending.    Viral panel and chest x-ray ordered to rule out specific viral etiology and pneumonia.    Progress Notes     Patient was initially treated with reassurance.    ED Course as of 07/10/21 2229   Caleen Essex Jul 10, 2021   2216 Chest x-ray reviewed by me which shows no acute cardiopulmonary disease.     Patient is comfortable following up on viral results on her [facility].  As this would not affect my management or treatment of the patient.    Images were reviewed  as results are noted above and were relayed to the patient and/or family at bedside. All questions were answered. Incidental findings were also relayed (if any) and patient was advised to have follow up with their PCP regarding these incidental findings.     Plan for discharge was discussed with patient/family. Patient/family are agreeable with plan.  ___    Independent Interpretation of Studies: Chest x-ray see above    Prescription Medications Considered But Not Prescribed: Cough syrup as patient states that another doctor had prescribed her codeine cough syrup.  Antibiotics considered due to extended period of illness however no focal consolidation is appreciated on chest x-ray I have a higher suspicion for viral etiology.  Diagnostic Tests Considered But Not Performed: Patient is well-appearing and is not currently having any chest pain.  I did consider blood work EKG and I do not feel these are necessary at this time as I have a higher suspicion for pneumonia versus viral etiology.    Disposition     Clinical Impression:   Final diagnoses:   Viral syndrome (Primary)   Shortness of breath     Final Disposition: Discharge    Ruben Gottron, MD       Marilynn Rail, MD  07/10/21 2229

## 2021-07-11 NOTE — Unmapped (Signed)
Pt alert and oriented x3 and answers all questions appropriately. RR even and unlabored. Pt in no acute distress. Pt given discharge information, follow-up information, and prescription x1 . Pt verbalized understanding and denies any other needs or complaints at this time. Pt ambulatory to lobby with steady gait.

## 2021-07-11 NOTE — Unmapped (Addendum)
Pt reports shortness of breath since Sunday. Pt states that she has not been feeling well the past couple of days. Pt has had a fever.

## 2021-07-14 NOTE — Unmapped (Signed)
[facility] Specialty Pharmacy Refill Coordination Note    Specialty Medication(s) to be Shipped:   Infectious Disease: Biktarvy    Other medication(s) to be shipped: No additional medications requested for fill at this time     Shelley Beck, DOB: [birth year]  Phone: [phone] (home)       All above HIPAA information was verified with patient.     Was a Nurse, learning disability used for this call? No    Completed refill call assessment today to schedule patient's medication shipment from the [facility] [phone]).  All relevant notes have been reviewed.     Specialty medication(s) and dose(s) confirmed: Regimen is correct and unchanged.   Changes to medications: Shelley Beck reports no changes at this time.  Changes to insurance: No  New side effects reported not previously addressed with a pharmacist or physician: None reported  Questions for the pharmacist: No    Confirmed patient received a Conservation officer, historic buildings and a Surveyor, mining with first shipment. The patient will receive a drug information handout for each medication shipped and additional FDA Medication Guides as required.       DISEASE/MEDICATION-SPECIFIC INFORMATION        N/A    SPECIALTY MEDICATION ADHERENCE     Medication Adherence    Patient reported X missed doses in the last month: 0  Specialty Medication: Biktarvy  Patient is on additional specialty medications: No  Patient is on more than two specialty medications: No  Any gaps in refill history greater than 2 weeks in the last 3 months: no  Demonstrates understanding of importance of adherence: yes  Informant: patient              Were doses missed due to medication being on hold? No    Biktarvy 50-200-25mg : Patient has 7 days of medication on hand     REFERRAL TO PHARMACIST     Referral to the pharmacist: Not needed      [facility]     Shipping address confirmed in Epic.     Delivery Scheduled: Yes, Expected medication delivery date: 1/27.     Medication will be delivered via Next Day Courier to the prescription address in Epic WAM.    Olga Millers   [facility] Specialty Technician

## 2021-07-16 MED FILL — BIKTARVY 50 MG-200 MG-25 MG TABLET: ORAL | 30 days supply | Qty: 30 | Fill #6

## 2021-08-13 NOTE — Unmapped (Signed)
[facility] Refill Coordination Note    Specialty Medication(s) to be Shipped:   Infectious Disease: Biktarvy    Other medication(s) to be shipped: No additional medications requested for fill at this time     Shelley Beck, DOB: 1976-04-12  Phone: [phone] (home)       All above HIPAA information was verified with patient.     Was a Nurse, learning disability used for this call? No    Completed refill call assessment today to schedule patient's medication shipment from the [facility] [phone]).  All relevant notes have been reviewed.     Specialty medication(s) and dose(s) confirmed: Regimen is correct and unchanged.   Changes to medications: Shelley Beck reports no changes at this time.  Changes to insurance: No  New side effects reported not previously addressed with a pharmacist or physician: None reported  Questions for the pharmacist: No    Confirmed patient received a Conservation officer, historic buildings and a Surveyor, mining with first shipment. The patient will receive a drug information handout for each medication shipped and additional FDA Medication Guides as required.       DISEASE/MEDICATION-SPECIFIC INFORMATION        N/A    SPECIALTY MEDICATION ADHERENCE     Medication Adherence    Patient reported X missed doses in the last month: 0  Specialty Medication: biktarvy              Were doses missed due to medication being on hold? No    biktarvy  : 7 days of medicine on hand        REFERRAL TO PHARMACIST     Referral to the pharmacist: Not needed      [facility]     Shipping address confirmed in Epic.     Delivery Scheduled: Yes, Expected medication delivery date: 2/28.     Medication will be delivered via Next Day Courier to the prescription address in Epic WAM.    [name]   [facility] Specialty Technician

## 2021-08-17 MED FILL — BIKTARVY 50 MG-200 MG-25 MG TABLET: ORAL | 30 days supply | Qty: 30 | Fill #7

## 2021-09-08 NOTE — Unmapped (Signed)
[facility] Clinical Assessment & Refill Coordination Note    Shelley Beck, [place]: 15-Nov-1975  Phone: [phone] (home)     All above HIPAA information was verified with patient.     Was a Nurse, learning disability used for this call? No    Specialty Medication(s):   Infectious Disease: Biktarvy     Current Outpatient Medications   Medication Sig Dispense Refill   ??? albuterol HFA 90 mcg/actuation inhaler Inhale 2 puffs every four (4) hours as needed for wheezing or shortness of breath. 8 g 0   ??? ascorbic acid, vitamin C, (VITAMIN C) 1000 MG tablet Take 2,000 mg by mouth in the morning.     ??? bictegrav-emtricit-tenofov ala (BIKTARVY) 50-200-25 mg tablet Take 1 tablet by mouth daily. 30 tablet 11   ??? calcium carbonate (OS-CAL) 1,500 mg (600 mg elem calcium) tablet Take 1 tablet by mouth in the morning.     ??? cholecalciferol, vitamin D3, (VITAMIN D3 ORAL) Take by mouth.     ??? elderberry fruit and flower 460-115 mg cap Take by mouth.     ??? ergocalciferol-1,250 mcg, 50,000 unit, (VITAMIN D2-1,250 MCG, 50,000 UNIT,) 1,250 mcg (50,000 unit) capsule Take 1,250 mcg by mouth once a week.     ??? escitalopram oxalate (LEXAPRO) 20 MG tablet Take 1 tablet (20 mg total) by mouth daily. 90 tablet 1   ??? fluticasone (FLONASE) 50 mcg/actuation nasal spray 2 sprays by Each Nare route daily. USE FOR 10-12 DAYS 16 g 0   ??? hydrOXYzine (ATARAX) 25 MG tablet TAKE 1 TO 2 TABLETS BY MOUTH THREE TIMES DAILY AS NEEDED FOR ANXIETY     ??? metFORMIN (GLUCOPHAGE-XR) 750 MG 24 hr tablet TAKE 1 TABLET BY MOUTH IN THE MORNING     ??? ondansetron (ZOFRAN-ODT) 4 MG disintegrating tablet PLACE 1 TABLET ON THE TONGUE AND ALLOW TO DISSOLVE EVERY 8 HOURS AS NEEDED     ??? phentermine 37.5 MG capsule TAKE 1 CAPSULE BY MOUTH IN THE MORNING     ??? traZODone (DESYREL) 50 MG tablet Take 1 tablet (50 mg total) by mouth nightly. 30 tablet 5   ??? zinc gluconate 50 mg (7 mg elemental zinc) tablet Take 50 mg by mouth in the morning.       No current facility-administered medications for this visit.        Changes to medications: Shanay reports no changes at this time.    No Known Allergies    Changes to allergies: No    SPECIALTY MEDICATION ADHERENCE     Biktarvy 50-200-25 mg: approximately 14 days of medicine on hand     Medication Adherence    Patient reported X missed doses in the last month: 0  Specialty Medication: Biktarvy 50-200-25mg   Patient is on additional specialty medications: No  Any gaps in refill history greater than 2 weeks in the last 3 months: no  Demonstrates understanding of importance of adherence: yes  Informant: patient  Provider-estimated medication adherence level: good  Patient is at risk for Non-Adherence: No  Confirmed plan for next specialty medication refill: delivery by pharmacy  Refills needed for supportive medications: not needed          Specialty medication(s) dose(s) confirmed: Regimen is correct and unchanged.     Are there any concerns with adherence? No    Adherence counseling provided? Not needed    CLINICAL MANAGEMENT AND INTERVENTION      Clinical Benefit Assessment:    Do you feel  the medicine is effective or helping your condition? Yes    HIV ASSOCIATED LABS:     Lab Results   Component Value Date/Time    HIVRS Detected (A) 11/24/2020 12:22 PM    HIVRS Detected (A) 10/24/2020 11:17 AM    HIVCP 709 (H) 11/24/2020 12:22 PM    HIVCP 161,096 (H) 10/24/2020 11:17 AM    ACD4 525 11/24/2020 12:22 PM    ACD4 480 (L) 10/30/2020 02:06 PM       Clinical Benefit counseling provided? Not needed    Adverse Effects Assessment:    Are you experiencing any side effects? No    Are you experiencing difficulty administering your medicine? No    Quality of Life Assessment:    How many days over the past month did your HIV  keep you from your normal activities? For example, brushing your teeth or getting up in the morning. 0    Have you discussed this with your provider? Not needed    Acute Infection Status:    Acute infections noted within Epic:  No active infections  Patient reported infection: None    Therapy Appropriateness:    Is therapy appropriate and patient progressing towards therapeutic goals? Yes, therapy is appropriate and should be continued    DISEASE/MEDICATION-SPECIFIC INFORMATION      N/A    PATIENT SPECIFIC NEEDS     - Does the patient have any physical, cognitive, or cultural barriers? No    - Is the patient high risk? No    - Does the patient require a Care Management Plan? No         SHIPPING     Specialty Medication(s) to be Shipped:   Infectious Disease: Biktarvy    Other medication(s) to be shipped: No additional medications requested for fill at this time     Changes to insurance: No    Delivery Scheduled: Yes, Expected medication delivery date: 09/16/21.     Medication will be delivered via Next Day Courier to the confirmed prescription address in [facility].    The patient will receive a drug information handout for each medication shipped and additional FDA Medication Guides as required.  Verified that patient has previously received a Conservation officer, historic buildings and a Surveyor, mining.    The patient or caregiver noted above participated in the development of this care plan and knows that they can request review of or adjustments to the care plan at any time.      All of the patient's questions and concerns have been addressed.    Roderic Palau   [facility] Specialty Pharmacist

## 2021-09-14 ENCOUNTER — Ambulatory Visit
Admit: 2021-09-14 | Discharge: 2021-09-15 | Payer: PRIVATE HEALTH INSURANCE | Attending: Physical Medicine & Rehabilitation | Primary: Physical Medicine & Rehabilitation

## 2021-09-14 DIAGNOSIS — E669 Obesity, unspecified: Principal | ICD-10-CM

## 2021-09-14 DIAGNOSIS — R7303 Prediabetes: Principal | ICD-10-CM

## 2021-09-14 DIAGNOSIS — R635 Abnormal weight gain: Principal | ICD-10-CM

## 2021-09-14 MED ORDER — WEGOVY 0.25 MG/0.5 ML SUBCUTANEOUS PEN INJECTOR
SUBCUTANEOUS | 0 refills | 0 days | Status: CP
Start: 2021-09-14 — End: ?

## 2021-09-14 NOTE — Unmapped (Addendum)
Nutrition:  Modified Low Carb/High Protein type of diet (limit junk food, breads, pastas, rice, sweets, sugary drinks, etc.). Watch portion size.  Drink more water.  Read handout about the Mediterranean plate.  Read handout about Healthy Meal and Healthy Snack ideas.  Can try Green Tea in the morning for energy and some appetite suppression.  [place] [facility] Path to Success Program to work on successful nutritional strategies including utilizing nutritional meal supplements.    Physical Activity:  Walk,strengthen, and/or cardio 5 days per week for about .  Try the chair strengthening and [place] [facility] exercise handouts as tolerated. YouTube.com has Seated [employer] or indoor walking videos with [employer].    Behavior Modification strategies:  -Mood:  Hold Referral to see [facility] clinical psychologist that specializes in the emotional journey of losing weight and healthy lifestyle changes, as well as stress management.    -Sleep:  OSA  No.  CPAP  No.    -Referral the [facility] Path to Success program that occurs weekly.     -Try the MyFitnessPal or LoseIt app .     -Given the Weight Management Plan check list handout.     Medications:     Weight gain causing medications: Lexapro for some people        Anti Obesity Medications: Wegovy (Semaglutide) weekly injection. Start 0.25mg  weekly x 4 weeks, then increase to 0.5mg  weekly x 4 weeks, then 1mg  weekly.  The prior authorization process that may take at least 2 weeks. We discussed  the risk and benefits of this medication. Information about this medication was given to the patient.     Obesity Surgery: None     ?Obesity-associated  comorbidities:  Prediabetes, situational anxiety, low back pain, GERD, Migraines    General Health:  -Ordered CMP lab to get done in 4-6 weeks at any [place] lab site after starting the medical weight loss Path to Success program.    Return to see Dr. Wilson Singer or her PA in about 2-3 months to re-evaluate healthy lifestyle changes and medication.      Please contact [facility] with any questions at [phone] or through [facility]. Call [phone] to schedule.

## 2021-09-14 NOTE — Unmapped (Signed)
[facility]  Obesity Medicine Note    Obesity Medicine Evaluation Summary:   Shelley Beck is a 46 y.o. female.  She presents with Obesity, BMI 43.7.  Her causes of obesity: decreased physical activity, poor eating habits, poor sleep, stress, anxiety, post pregnancy weight gain, and medications like lexapro.  Co-morbidities at this time include s/p endometrial ablation, Prediabetes, situational anxiety, HIV, elevated LFTs, vitamin D def hx, left knee pain, s/p spine surgery for scoliosis, LBP, low back pain, GERD, Migraines, s/p lap cholecystectomy    Chico Goree RN in the ED [place].    Obesity Medicine Recommendations   Based on my assessment of the possible etiologies for the patient's obesity, medical co-morbidities, my recommendations include the following :      Nutrition:  Modified Low Carb/High Protein type of diet (limit junk food, breads, pastas, rice, sweets, sugary drinks, etc.). Watch portion size.  Drink more water.  Read handout about the Mediterranean plate.  Read handout about Healthy Meal and Healthy Snack ideas.  Can try Green Tea in the morning for energy and some appetite suppression.  [facility] Path to Success Program to work on successful nutritional strategies including utilizing nutritional meal supplements.    Physical Activity:  Walk,strengthen, and/or cardio 5 days per week for about .  Try the chair strengthening and [place] [facility] exercise handouts as tolerated. YouTube.com has Seated [employer] or indoor walking videos with [employer].    Behavior Modification strategies:  -Mood:  Hold Referral to see [facility] clinical psychologist that specializes in the emotional journey of losing weight and healthy lifestyle changes, as well as stress management.    -Sleep:  OSA  No.  CPAP  No.    -Referral the [facility] Path to Success program that occurs weekly.     -Try the MyFitnessPal or LoseIt app .     -Given the Weight Management Plan check list handout.     Medications:     Weight gain causing medications: Lexapro for some people        Anti Obesity Medications: Wegovy (Semaglutide) weekly injection. Start 0.25mg  weekly x 4 weeks, then increase to 0.5mg  weekly x 4 weeks, then 1mg  weekly.  The prior authorization process that may take at least 2 weeks. We discussed  the risk and benefits of this medication. Information about this medication was given to the patient.   Previous use of anti-obesity medications:  The answer is No to the following medications, except for:  Phentermine: Yes. SE - heart flutter.   Metformin: Yes. SE - diarrhea.   Liraglutide:   SGLT-2 inhibitor:   Bupropion:  Topiramate:   Other agents:     Obesity Surgery: None     ?Obesity-associated  comorbidities:  Prediabetes, situational anxiety, LBP, low back pain, GERD, Migraines.     General Health:  -Ordered CMP lab to get done in 4-6 weeks at any [place] lab site after starting the medical weight loss Path to Success program.    Return to see Dr. Wilson Singer or her PA in about 2-3 months to re-evaluate healthy lifestyle changes and medication.      Please contact [facility] with any questions at [phone] or through [facility]. Call [phone] to schedule.       I spent time with the patient with >50% of the time spent counseling about weight loss, nutrition, physical activity, behavior modification, medications and coordination of care. I personally spent  60 minutes face-to-face and non-face-to-face in the care of this patient, which includes all pre, intra, and post visit time on the date of service.    Austin Miles, MD  Obesity Medicine  Physical Medicine & Rehabilitation  ____________________________________________________________________________________________________________________________________________  Shelley Beck was seen in consultation at the request of PCP  for comprehensive evaluation for the management of worsening obesity and function. I reviewed her medical records.     CC/HPI:  Chief Complaint   Patient presents with    Obesity       Shelley Beck is a 46 y.o. female Body mass index is 43.73 kg/m??..  Primary reason for wanting obesity treatment : Weight gain; feels out of control   Overall Goal: To lose weight  Motivation and Readiness for Change: Ready.  Treatment of interest: lifestyle , pharmacologic, and surgical options.    Weight History:  Highest adult weight: 246 lb.  Lowest usual adult weight: 128 lb.  Weight issues as a youth: None.     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   Walking, water, no arb, fruits and veggies.   Kids.     Other contributors to weight gain: past medications associated weight gain    Developmental contributions:  post pregnancy weight gain    Previous Tactics for Weight Loss:  What diets worked well  for you in the past? Smaller portions.  Are you currently working with a RD? no  Any weight loss programs? Yes, physician  Any weight loss supplements? Yes.  Physical activity? Yes.     Medications:  Current medications with potential to cause weight gain:yes, .    Previous use of anti-obesity medications:  The answer is No to the following medications, except for:  Phentermine: Yes. SE - heart flutter.   Metformin: Yes. SE - diarrhea.   Liraglutide:   SGLT-2 inhibitor:   Bupropion:  Topiramate:   Other agents:     Patient's Perception on Barriers to Weight Loss: None.     Current Nutrition:  Food Allergies and/or Intolerances: None.     24 hr recall:       Breakfast: may skip. Night shift. Or [employer].   Lunch : TV dinner.   Dinner: pizza  Snacks: snacks, cookies; now cucumber , nuts. Sweets.   Beverages: juice water.       Eating behaviors:  Skipping meals  Yes.  Eating when not hungry  Yes.  Eating for comfort when stressed or emotional Yes.  Ever find evidence that you have been eating when you were asleep: No.  Eat late at night or wake up at night and eat:  Yes. Night shift.   Overeating: Yes.  Times when you are eating and it literally feels like you can't stop or have loss of control:  No.  Did you ever try to manage your weight by vomiting, using laxatives, diuretics, or excessive exercise? no  History of Eating Disorders (binge eating disorder, anorexia, bulimia, other): No.    Physical Activity:  -Types of physical activity enjoyment:    -Barriers to physical activity: s/p spine surgery for scoliosis.     -Current Exercise Program: Walking in the AM 2 miles daily. 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   OSA  No.  CPAP  No.    Mental Health History(emotional health):  Anxiety: yes, .  15  years ago, panic attack.   Depression: no  Any thoughts about harming yourself or wanting to die: no  Any alcohol or substance abuse, including prescription abuse : no      Past Medical and Surgical History:  Past Medical History:   Diagnosis Date    Anxiety     managed on lexapro    HIV disease (CMS-HCC)     Neck pain        Past Surgical History:   Procedure Laterality Date    CHOLECYSTECTOMY      nsvd      X3    scoliosis repair      uterine ablation         Allergies: Reviewed  Patient has no known allergies.    Medication list:    Current Outpatient Medications:     ascorbic acid, vitamin C, (VITAMIN C) 1000 MG tablet, Take 2 tablets (2,000 mg total) by mouth daily., Disp: , Rfl:     bictegrav-emtricit-tenofov ala (BIKTARVY) 50-200-25 mg tablet, Take 1 tablet by mouth daily., Disp: 30 tablet, Rfl: 11    elderberry fruit and flower 460-115 mg cap, Take by mouth., Disp: , Rfl:     escitalopram oxalate (LEXAPRO) 20 MG tablet, Take 1 tablet (20 mg total) by mouth daily., Disp: 90 tablet, Rfl: 1    ondansetron (ZOFRAN-ODT) 4 MG disintegrating tablet, PLACE 1 TABLET ON THE TONGUE AND ALLOW TO DISSOLVE EVERY 8 HOURS AS NEEDED, Disp: , Rfl:     zinc gluconate 50 mg (7 mg elemental zinc) tablet, Take 1 tablet (50 mg total) by mouth daily., Disp: , Rfl:     albuterol HFA 90 mcg/actuation inhaler, Inhale 2 puffs every four (4) hours as needed for wheezing or shortness of breath. (Patient not taking: Reported on 09/14/2021), Disp: 8 g, Rfl: 0    calcium carbonate (OS-CAL) 1,500 mg (600 mg elem calcium) tablet, Take 1 tablet by mouth in the morning., Disp: , Rfl:     cholecalciferol, vitamin D3, (VITAMIN D3 ORAL), Take by mouth., Disp: , Rfl:     ergocalciferol-1,250 mcg, 50,000 unit, (DRISDOL) 1,250 mcg (50,000 unit) capsule, Take 1 capsule (1,250 mcg total) by mouth once a week., Disp: , Rfl:     fluticasone (FLONASE) 50 mcg/actuation nasal spray, 2 sprays by Each Nare route daily. USE FOR 10-12 DAYS, Disp: 16 g, Rfl: 0    hydrOXYzine (ATARAX) 25 MG tablet, TAKE 1 TO 2 TABLETS BY MOUTH THREE TIMES DAILY AS NEEDED FOR ANXIETY, Disp: , Rfl:     metFORMIN (GLUCOPHAGE-XR) 750 MG 24 hr tablet, TAKE 1 TABLET BY MOUTH IN THE MORNING, Disp: , Rfl:     phentermine 37.5 MG capsule, TAKE 1 CAPSULE BY MOUTH IN THE MORNING, Disp: , Rfl:     traZODone (DESYREL) 50 MG tablet, Take 1 tablet (50 mg total) by mouth nightly., Disp: 30 tablet, Rfl: 5    WEGOVY 0.25 MG/0.5 ML SUBCUTANEOUS PEN INJECTOR, Inject 0.25 mg under the skin once a week., Disp: 2 mL, Rfl: 0    [START ON 10/05/2021] WEGOVY 0.5 MG/0.5 ML SUBCUTANEOUS PEN INJECTOR, Inject 0.5 mg under the skin once a week., Disp: 2 mL, Rfl: 0    [START ON 11/02/2021] WEGOVY 1 MG/0.5 ML SUBCUTANEOUS PEN INJECTOR, Inject 1 mg under the skin once a week., Disp: 2 mL, Rfl: 1    Above medication list reviewed and  updated.    Relevant symptom review:  Glaucoma: no  Palpitations: yes, panic attack  Chest Pain: no  Headaches: Yes.   Nephrolithiasis: no  Seizures: no  H/o pancreatitis: no  Personal or family history of medullary cancer of thyroid:no    Heartburn yes.     Symptoms of cushings disease: no  Irregular menstrual cycles: No, s/p ablation .   Excessive facial hair:No    All other systems reviewed were negative  10 organ systems reviewed and pertinent as noted in HPI.     Relevant Family History:  Family History   Problem Relation Age of Onset    No Known Problems Mother     No Known Problems Father        Social History  Social History     Socioeconomic History    Marital status: Divorced     Spouse name: None    Number of children: None    Years of education: None    Highest education level: None   Tobacco Use    Smoking status: Never    Smokeless tobacco: Never   Vaping Use    Vaping Use: Former   Substance and Sexual Activity    Alcohol use: Yes     Comment: socially    Drug use: No    Sexual activity: Yes     Partners: Male           The following portions of the patient's history were reviewed in the electronic medical record and updated as appropriate:   Allergies  Current medications  Past medical history  Past surgical history  Past social history  Past family history  Problem list    -Work: [employer] ; ED Charity fundraiser.    Functional Status: Independent with ADLs and mobility:  *Dressing  *Bathing  *Toileting  *Ambulation  *Transfers (moving from bed to chair, chair to toilet, etc.)  *Cooking  *Eating      Vital Signs  Blood pressure 110/70, pulse 85, temperature 36.7 ??C (98 ??F), temperature source Oral, height 160 cm (5' 2.99), weight (!) 111.9 kg (246 lb 12.8 oz), not currently breastfeeding.  Body mass index is 43.73 kg/m??.    PE:  General exam:Patient appears calm and alert and oriented.  Body fat distribution Central adiposity and Gluteofemoral adiposity No supraclavicular adiposity.  No dorsal adiposity.    Thyroid exam : NAD  Lymphatics: NAD  Skin exam: No acanthosis, no lipomas.  Oral exam : Tongue moist, pink. moderateOP crowding.  Good dental hygiene  CVS: RRR nl. s1s2, Peripheral edema - none  Respiratory: CTAB    Gastrointestinal  Abdomen soft. NT/ND.   Musculoskeletal: Good ROM of extremities. No deformities.  Gait: Antalgic gait pattern.    Results:   Lab work reviewed in [facility]  Previous studies including imaging and labs were reviewed. I independently reviewed the image and labs and they were unremarkable, except for the following HbA1C 5.9 in 10/2020.

## 2021-09-15 MED FILL — BIKTARVY 50 MG-200 MG-25 MG TABLET: ORAL | 30 days supply | Qty: 30 | Fill #8

## 2021-09-16 ENCOUNTER — Ambulatory Visit: Admit: 2021-09-16 | Discharge: 2021-09-17 | Payer: PRIVATE HEALTH INSURANCE | Attending: Family | Primary: Family

## 2021-09-16 ENCOUNTER — Ambulatory Visit: Admit: 2021-09-16 | Discharge: 2021-09-17 | Payer: PRIVATE HEALTH INSURANCE

## 2021-09-16 DIAGNOSIS — Z6841 Body Mass Index (BMI) 40.0 and over, adult: Principal | ICD-10-CM

## 2021-09-16 DIAGNOSIS — E8881 Metabolic syndrome: Principal | ICD-10-CM

## 2021-09-16 DIAGNOSIS — Z79899 Other long term (current) drug therapy: Principal | ICD-10-CM

## 2021-09-16 DIAGNOSIS — B20 Human immunodeficiency virus [HIV] disease: Principal | ICD-10-CM

## 2021-09-16 DIAGNOSIS — Z717 Human immunodeficiency virus [HIV] counseling: Principal | ICD-10-CM

## 2021-09-16 LAB — CBC W/ AUTO DIFF
BASOPHILS ABSOLUTE COUNT: 0.1 10*9/L (ref 0.0–0.1)
BASOPHILS RELATIVE PERCENT: 0.8 %
EOSINOPHILS ABSOLUTE COUNT: 0.1 10*9/L (ref 0.0–0.5)
EOSINOPHILS RELATIVE PERCENT: 0.9 %
HEMATOCRIT: 36.9 % (ref 34.0–44.0)
HEMOGLOBIN: 12.3 g/dL (ref 11.3–14.9)
LYMPHOCYTES ABSOLUTE COUNT: 1.8 10*9/L (ref 1.1–3.6)
LYMPHOCYTES RELATIVE PERCENT: 19.9 %
MEAN CORPUSCULAR HEMOGLOBIN CONC: 33.2 g/dL (ref 32.0–36.0)
MEAN CORPUSCULAR HEMOGLOBIN: 26.3 pg (ref 25.9–32.4)
MEAN CORPUSCULAR VOLUME: 79.1 fL (ref 77.6–95.7)
MEAN PLATELET VOLUME: 8.1 fL (ref 6.8–10.7)
MONOCYTES ABSOLUTE COUNT: 0.9 10*9/L — ABNORMAL HIGH (ref 0.3–0.8)
MONOCYTES RELATIVE PERCENT: 9.6 %
NEUTROPHILS ABSOLUTE COUNT: 6.3 10*9/L (ref 1.8–7.8)
NEUTROPHILS RELATIVE PERCENT: 68.8 %
PLATELET COUNT: 272 10*9/L (ref 150–450)
RED BLOOD CELL COUNT: 4.67 10*12/L (ref 3.95–5.13)
RED CELL DISTRIBUTION WIDTH: 15.6 % — ABNORMAL HIGH (ref 12.2–15.2)
WBC ADJUSTED: 9.2 10*9/L (ref 3.6–11.2)

## 2021-09-16 LAB — COMPREHENSIVE METABOLIC PANEL
ALBUMIN: 3.5 g/dL (ref 3.4–5.0)
ALKALINE PHOSPHATASE: 52 U/L (ref 46–116)
ALT (SGPT): 19 U/L (ref 10–49)
ANION GAP: 8 mmol/L (ref 3–11)
AST (SGOT): 22 U/L (ref <34)
BILIRUBIN TOTAL: 0.3 mg/dL (ref 0.3–1.2)
BLOOD UREA NITROGEN: 7 mg/dL — ABNORMAL LOW (ref 9–23)
BUN / CREAT RATIO: 8
CALCIUM: 9.4 mg/dL (ref 8.7–10.4)
CHLORIDE: 105 mmol/L (ref 98–107)
CO2: 28 mmol/L (ref 20.0–31.0)
CREATININE: 0.84 mg/dL — ABNORMAL HIGH (ref 0.50–0.80)
EGFR CKD-EPI (2021) FEMALE: 86 mL/min/{1.73_m2} (ref >=60)
GLUCOSE RANDOM: 97 mg/dL (ref 70–179)
POTASSIUM: 3.6 mmol/L (ref 3.5–5.1)
PROTEIN TOTAL: 6.9 g/dL (ref 5.7–8.2)
SODIUM: 141 mmol/L (ref 136–145)

## 2021-09-16 MED ORDER — MOUNJARO 2.5 MG/0.5 ML SUBCUTANEOUS PEN INJECTOR
SUBCUTANEOUS | 0 refills | 28 days | Status: CP
Start: 2021-09-16 — End: 2021-10-14

## 2021-09-16 NOTE — Unmapped (Signed)
Assessment/Plan:      Diagnosis ICD-10-CM Associated Orders   1. HIV disease (CMS-HCC)  B20 CBC w/ Differential     Metabolic panel, Comp (Chem10 + LFTs)     HIV RNA, Quantitative, PCR     Lymphocyte Markers Limited     Syphilis Screen     tirzepatide (MOUNJARO) 2.5 mg/0.5 mL PnIj      2. Morbid obesity with BMI of 45.0-49.9, adult (CMS-HCC)  E66.01 CBC w/ Differential    Z68.42 Metabolic panel, Comp (Chem10 + LFTs)     HIV RNA, Quantitative, PCR     Lymphocyte Markers Limited     Syphilis Screen     tirzepatide (MOUNJARO) 2.5 mg/0.5 mL PnIj      3. Insulin resistance  E88.81 CBC w/ Differential     Metabolic panel, Comp (Chem10 + LFTs)     HIV RNA, Quantitative, PCR     Lymphocyte Markers Limited     Syphilis Screen     tirzepatide (MOUNJARO) 2.5 mg/0.5 mL PnIj      4. Metabolic syndrome  E88.81 CBC w/ Differential     Metabolic panel, Comp (Chem10 + LFTs)     HIV RNA, Quantitative, PCR     Lymphocyte Markers Limited     Syphilis Screen     tirzepatide (MOUNJARO) 2.5 mg/0.5 mL PnIj      5. Encounter for HIV counseling  Z71.7 CBC w/ Differential     Metabolic panel, Comp (Chem10 + LFTs)     HIV RNA, Quantitative, PCR     Lymphocyte Markers Limited     Syphilis Screen     tirzepatide (MOUNJARO) 2.5 mg/0.5 mL PnIj      6. On highly active antiretroviral therapy (HAART)  Z79.899 CBC w/ Differential     Metabolic panel, Comp (Chem10 + LFTs)     HIV RNA, Quantitative, PCR     Lymphocyte Markers Limited     Syphilis Screen     tirzepatide (MOUNJARO) 2.5 mg/0.5 mL PnIj          Return in about 1 month (around 10/17/2021), or 4 month.    HPI:     Shelley Beck is a 46 y.o. year old Black/African American female who presents today for HIV follow-up.  Referred by [facility] for new HIV diagnosis. Patient is a beautiful, intelligent travel nurse. ??Here for HIV follow up.  Has expereienced cervical lymphadenopathy, swollen tonsils, and fatigue for the last few months, has resolved since starting HAART Biktarvy.  Denies fever, chills, NVD, night sweats. ??Went in for routine labs and HIV1 antibody returned positive. ??Patient is treatment naive. ??Her boyfriend, [name], has been tested and is HIV positive also, on Biktarvy. CD4% 35 Absolute CD4 525 HIV RNA 709, Hep ABC negative, RPR non-reactive. CMV positive.  Discussed HIV biology, medication resistance, medication compliance, safe sex with condoms, U=U. ??Referred to [facility] for counseling. ??Patient has been taking HAART Biktarvy with no missed doses or bothersome side effects.  Potential medication side effects discussed. HIV labs today. In addition, patient has gained weight.  247 lbs today.  She is interested in starting Mounjaro injectable for weight loss.  She has attempted diet and exercise with no results.  Discussed insulin resistance, autophagy, intermittent fasting.  Denies personal or family history of thyroid cancer, no gallbladder.  Discussed potentila medication side effects of Mounjaro.  Follow up in 1 month for weight loss, 4 months for HIV.   ??????????  The patient's medical record has been reviewed and the patient interviewed.????Previous imaging, labs,??and culture reports reviewed. Total time spent reviewing chart, time with patient, decision making and planning??25??minutes????    Past Medical/Surgical History:     Past Medical History:   Diagnosis Date   ??? Anxiety     managed on lexapro   ??? HIV disease (CMS-HCC)    ??? Neck pain      Past Surgical History:   Procedure Laterality Date   ??? CHOLECYSTECTOMY     ??? nsvd      X3   ??? scoliosis repair     ??? uterine ablation         Social History:     Social History     Socioeconomic History   ??? Marital status: Divorced     Spouse name: None   ??? Number of children: None   ??? Years of education: None   ??? Highest education level: None   Tobacco Use   ??? Smoking status: Never   ??? Smokeless tobacco: Never   Vaping Use   ??? Vaping Use: Former   Substance and Sexual Activity   ??? Alcohol use: Yes     Comment: socially   ??? Drug use: No   ??? Sexual activity: Yes     Partners: Male       Family History:     Family History   Problem Relation Age of Onset   ??? No Known Problems Mother    ??? No Known Problems Father        Allergies:     Patient has no known allergies.    Current Medications:     Current Outpatient Medications   Medication Sig Dispense Refill   ??? ascorbic acid, vitamin C, (VITAMIN C) 1000 MG tablet Take 2 tablets (2,000 mg total) by mouth daily.     ??? bictegrav-emtricit-tenofov ala (BIKTARVY) 50-200-25 mg tablet Take 1 tablet by mouth daily. 30 tablet 11   ??? elderberry fruit and flower 460-115 mg cap Take by mouth.     ??? escitalopram oxalate (LEXAPRO) 20 MG tablet Take 1 tablet (20 mg total) by mouth daily. 90 tablet 1   ??? hydrOXYzine (ATARAX) 25 MG tablet TAKE 1 TO 2 TABLETS BY MOUTH THREE TIMES DAILY AS NEEDED FOR ANXIETY     ??? ondansetron (ZOFRAN-ODT) 4 MG disintegrating tablet PLACE 1 TABLET ON THE TONGUE AND ALLOW TO DISSOLVE EVERY 8 HOURS AS NEEDED     ??? WEGOVY 0.25 MG/0.5 ML SUBCUTANEOUS PEN INJECTOR Inject 0.25 mg under the skin once a week. 2 mL 0   ??? [START ON 10/05/2021] WEGOVY 0.5 MG/0.5 ML SUBCUTANEOUS PEN INJECTOR Inject 0.5 mg under the skin once a week. 2 mL 0   ??? [START ON 11/02/2021] WEGOVY 1 MG/0.5 ML SUBCUTANEOUS PEN INJECTOR Inject 1 mg under the skin once a week. 2 mL 1   ??? zinc gluconate 50 mg (7 mg elemental zinc) tablet Take 1 tablet (50 mg total) by mouth daily.     ??? albuterol HFA 90 mcg/actuation inhaler Inhale 2 puffs every four (4) hours as needed for wheezing or shortness of breath. (Patient not taking: Reported on 09/14/2021) 8 g 0   ??? calcium carbonate (OS-CAL) 1,500 mg (600 mg elem calcium) tablet Take 1 tablet by mouth in the morning. (Patient not taking: Reported on 09/16/2021)     ??? metFORMIN (GLUCOPHAGE-XR) 750 MG 24 hr tablet TAKE 1 TABLET BY MOUTH IN THE MORNING (Patient  not taking: Reported on 09/16/2021)     ??? tirzepatide (MOUNJARO) 2.5 mg/0.5 mL PnIj Inject 2.5 mg under the skin every seven (7) days for 28 days. 2 mL 0     No current facility-administered medications for this visit.       ROS:     Constitutional: Negative for fever, chills, appetite change, fatigue and unexpected weight change.   HEENT:  Head: Negative for headache, head injury, dizziness, lightheadedness.  Eyes: Glasses or contact lenses.  Negative for pain, redness, excessive tearing, double or blurred vision, spots, specks, flashing lights, glaucoma, cataracts, photophobia.  Ears:  Negative for hearing change, tinnitus, vertigo, earache, infection, discharge.  Nose and sinuses: Negative for frequent colds, nasal stuffiness, discharge or itching, hay fever, nosebleeds.  Throat: Negative for dental abscess, bleeding gums, dentures, sore tongue, dry mouth, frequent sore throats, hoarseness.    Neck:  Negative for lumps, swollen glands, goiter, pain, stiffness  Respiratory: Negative for cough, sputum, hemoptysis, dyspnea, wheezing, pleurisy, last chest x-ray.  Negative for asthma, bronchitis, emphysema, pneumonia, tuberculosis.??   Cardiovascular: Negative for chest pain, palpitations, hypertension, rheumatic fever, heart murmurs, dyspnea, orthopnea, paroxysmal nocturnal dyspnea, edema, and leg swelling.   Gastrointestinal: Negative for dysphagia, nausea, vomiting, abdominal pain, diarrhea, constipation, blood in stool, abdominal distention, heartburn, appetite change, food intolerance, and anal bleeding.   Endocrine: Negative for cold intolerance, heat intolerance, polydipsia, polyphagia and polyuria.   Genitourinary: Negative for dysuria, urgency, frequency, flank pain, difficulty urinating, hematuria, hesitancy, dribbling, difficulty starting/stopping stream, and genital sores.   Musculoskeletal: Negative for myalgias, back pain, joint swelling, arthralgias, gait problem and neck stiffness. ??  Skin: Negative for color change, pallor, rash, itching, and wound.   Allergic/Immunologic: Negative for environmental allergies and food allergies.   Neurological: Negative for dizziness, tremors, seizures, syncope, speech difficulty, weakness, light-headedness, numbness and headaches.  Negative for changes in mood, attention, orientation, or speech.   Hematological: Negative for adenopathy. Does not bruise/bleed easily.  Negative transfusion reactions.   Psychiatric/Behavioral: Negative for suicidal ideas, hallucinations, behavioral problems, confusion, sleep disturbance, self-injury, dyphoric mood and agitation. The patient is not nervous/anxious.??     Vital Signs:     Vitals:    09/16/21 0954   BP: 119/74   Pulse: 89   Resp: 16   Temp: 36.3 ??C (97.3 ??F)   SpO2: 93%     BP Readings from Last 3 Encounters:   09/16/21 119/74   09/14/21 110/70   07/10/21 165/108     Wt Readings from Last 3 Encounters:   09/16/21 (!) 116.6 kg (257 lb)   09/14/21 (!) 111.9 kg (246 lb 12.8 oz)   11/24/20 (!) 106.1 kg (234 lb)       Immunizations:     Immunization History   Administered Date(s) Administered   ??? COVID-19 VACC,MRNA,(PFIZER)(PF) 07/24/2019, 08/14/2019, 04/02/2020   ??? Influenza Virus Vaccine, unspecified formulation 03/13/2017   ??? PPD Test 01/16/2017, 03/26/2020, 10/28/2020, 12/01/2020   ??? TdaP 10/28/2020       Labs:     Platelet   Date Value Ref Range Status   11/24/2020 301 150 - 450 10*9/L Final     HGB   Date Value Ref Range Status   11/24/2020 12.1 12.0 - 15.5 g/dL Final     WBC   Date Value Ref Range Status   11/24/2020 4.8 3.5 - 10.5 10*9/L Final     BUN   Date Value Ref Range Status   11/24/2020 8 (L) 9 - 23 mg/dL  Final     Creatinine   Date Value Ref Range Status   11/24/2020 0.82 (H) 0.50 - 0.80 mg/dL Final     ALT   Date Value Ref Range Status   11/24/2020 44 10 - 49 U/L Final     AST   Date Value Ref Range Status   11/24/2020 33 <34 U/L Final     Potassium   Date Value Ref Range Status   11/24/2020 4.2 3.5 - 5.1 mmol/L Final     CO2   Date Value Ref Range Status   11/24/2020 28.0 20.0 - 31.0 mmol/L Final     HIV RNA   Date Value Ref Range Status   11/24/2020 709 (H) <0 copies/mL Final     Hepatitis C Ab   Date Value Ref Range Status   10/24/2020 Nonreactive Nonreactive Final     Gonorrhoeae NAA   Date Value Ref Range Status   10/24/2020 Negative Negative Final     Chlamydia trachomatis, NAA   Date Value Ref Range Status   10/24/2020 Negative Negative Final     Urine Culture, Comprehensive   Date Value Ref Range Status   12/12/2018 50,000 to 100,000 CFU/mL Klebsiella pneumoniae (A)  Final   12/12/2018 <10,000 CFU/mL Normal Urogenital Flora  Final     CD4% (T Helper)   Date Value Ref Range Status   11/24/2020 35 34 - 58 % Final         Physical Exam:     General: Well-appearing, calm, well-groomed, well-nourished.  No distress  Eyes: PERRL. Extraocular muscles intact, sclera clear   ENT: Nares without discharge, oropharynx without lesions or exudate.   Neck: Supple, no thyromegaly, bruit, or JVD.   Lymph Nodes: No adenopathy (cervical, axillary)   Cardiovascular: RRR, S1 and S2 normal. No murmur, rub, or gallop.   Lungs: Clear to auscultation bilaterally without wheezes/crackles/rhonchi. Good air movement.   Skin: No rashes or lesions noted on limited exam. Warm and dry with no erythema or pallor.  Abdomen: Normoactive bowel sounds, abdomen soft, nontender, and nondistended, no hepatosplenomegaly or masses. Liver normal in size, no rebound or guarding.   Genitourinary: Deferred   Extremities: No cyanosis, clubbing or edema. No joint effusions, FROM without tenderness.   Musculoskeletal: No spine or costovertebral angle tenderness.   Neuro: Alert and oriented to person, place, and time. Cranial nerves II-XII grossly intact, normal gait, normal sensation throughout, normal cerebellar function.  Psychiatry: Alert and oriented to person, place, and time, conversant, appropriate affect.   Nursing notes and vitals reviewed

## 2021-09-17 LAB — LYMPH MARKER LIMITED,FLOW
ABSOLUTE CD3 CNT: 1260 {cells}/uL (ref 915–3400)
ABSOLUTE CD4 CNT: 756 {cells}/uL (ref 510–2320)
ABSOLUTE CD8 CNT: 486 {cells}/uL (ref 180–1520)
CD3% (T CELLS): 70 % (ref 61–86)
CD4% (T HELPER): 42 % (ref 34–58)
CD4:CD8 RATIO: 1.6 (ref 0.9–4.8)
CD8% T SUPPRESR: 27 % (ref 12–38)

## 2021-09-17 LAB — SYPHILIS SCREEN: SYPHILIS AB IGG/IGM SCREEN: NEGATIVE

## 2021-09-18 LAB — HIV RNA, QUANTITATIVE, PCR: HIV RNA QNT RSLT: NOT DETECTED

## 2021-10-05 MED ORDER — WEGOVY 0.5 MG/0.5 ML SUBCUTANEOUS PEN INJECTOR
SUBCUTANEOUS | 0 refills | 0 days | Status: CP
Start: 2021-10-05 — End: ?

## 2021-10-07 NOTE — Unmapped (Signed)
[facility] Refill Coordination Note    Specialty Medication(s) to be Shipped:   Infectious Disease: Biktarvy    Other medication(s) to be shipped: No additional medications requested for fill at this time     Shelley Beck, DOB: 06-15-1976  Phone: [phone] (home)       All above HIPAA information was verified with patient.     Was a Nurse, learning disability used for this call? No    Completed refill call assessment today to schedule patient's medication shipment from the [facility] [phone]).  All relevant notes have been reviewed.     Specialty medication(s) and dose(s) confirmed: Regimen is correct and unchanged.   Changes to medications: [name] reports no changes at this time.  Changes to insurance: No  New side effects reported not previously addressed with a pharmacist or physician: None reported  Questions for the pharmacist: No    Confirmed patient received a Conservation officer, historic buildings and a Surveyor, mining with first shipment. The patient will receive a drug information handout for each medication shipped and additional FDA Medication Guides as required.       DISEASE/MEDICATION-SPECIFIC INFORMATION        N/A    SPECIALTY MEDICATION ADHERENCE     Medication Adherence    Patient reported X missed doses in the last month: 0  Specialty Medication: BIKTARVY  Patient is on additional specialty medications: No              Were doses missed due to medication being on hold? No    Biktravy 50-200-25 mg: 7 days of medicine on hand        REFERRAL TO PHARMACIST     Referral to the pharmacist: Not needed      [facility]     Shipping address confirmed in Epic.     Delivery Scheduled: Yes, Expected medication delivery date: 10/13/21.     Medication will be delivered via Next Day Courier to the prescription address in Epic WAM.    Shelley Beck   [facility] Specialty Technician

## 2021-10-12 MED FILL — BIKTARVY 50 MG-200 MG-25 MG TABLET: ORAL | 30 days supply | Qty: 30 | Fill #9

## 2021-10-21 DIAGNOSIS — E8881 Metabolic syndrome: Principal | ICD-10-CM

## 2021-10-21 DIAGNOSIS — Z6841 Body Mass Index (BMI) 40.0 and over, adult: Principal | ICD-10-CM

## 2021-10-21 MED ORDER — MOUNJARO 5 MG/0.5 ML SUBCUTANEOUS PEN INJECTOR
SUBCUTANEOUS | 0 refills | 28 days | Status: CP
Start: 2021-10-21 — End: 2021-11-18

## 2021-10-27 ENCOUNTER — Ambulatory Visit: Admit: 2021-10-27 | Discharge: 2021-10-28 | Payer: PRIVATE HEALTH INSURANCE

## 2021-10-27 ENCOUNTER — Ambulatory Visit: Admit: 2021-10-27 | Discharge: 2021-10-28 | Payer: PRIVATE HEALTH INSURANCE | Attending: Family | Primary: Family

## 2021-10-27 DIAGNOSIS — Z79899 Other long term (current) drug therapy: Principal | ICD-10-CM

## 2021-10-27 DIAGNOSIS — Z6841 Body Mass Index (BMI) 40.0 and over, adult: Principal | ICD-10-CM

## 2021-10-27 DIAGNOSIS — R110 Nausea: Principal | ICD-10-CM

## 2021-10-27 DIAGNOSIS — M1711 Unilateral primary osteoarthritis, right knee: Principal | ICD-10-CM

## 2021-10-27 DIAGNOSIS — E8881 Metabolic syndrome: Principal | ICD-10-CM

## 2021-10-27 LAB — CBC W/ AUTO DIFF
BASOPHILS ABSOLUTE COUNT: 0 10*9/L (ref 0.0–0.1)
BASOPHILS RELATIVE PERCENT: 0.3 %
EOSINOPHILS ABSOLUTE COUNT: 0.1 10*9/L (ref 0.0–0.5)
EOSINOPHILS RELATIVE PERCENT: 3.1 %
HEMATOCRIT: 38.6 % (ref 34.0–44.0)
HEMOGLOBIN: 12.9 g/dL (ref 11.3–14.9)
LYMPHOCYTES ABSOLUTE COUNT: 1.5 10*9/L (ref 1.1–3.6)
LYMPHOCYTES RELATIVE PERCENT: 31.5 %
MEAN CORPUSCULAR HEMOGLOBIN CONC: 33.4 g/dL (ref 32.0–36.0)
MEAN CORPUSCULAR HEMOGLOBIN: 26.4 pg (ref 25.9–32.4)
MEAN CORPUSCULAR VOLUME: 79.1 fL (ref 77.6–95.7)
MEAN PLATELET VOLUME: 8.2 fL (ref 6.8–10.7)
MONOCYTES ABSOLUTE COUNT: 0.6 10*9/L (ref 0.3–0.8)
MONOCYTES RELATIVE PERCENT: 11.7 %
NEUTROPHILS ABSOLUTE COUNT: 2.5 10*9/L (ref 1.8–7.8)
NEUTROPHILS RELATIVE PERCENT: 53.4 %
PLATELET COUNT: 326 10*9/L (ref 150–450)
RED BLOOD CELL COUNT: 4.88 10*12/L (ref 3.95–5.13)
RED CELL DISTRIBUTION WIDTH: 16.2 % — ABNORMAL HIGH (ref 12.2–15.2)
WBC ADJUSTED: 4.7 10*9/L (ref 3.6–11.2)

## 2021-10-27 LAB — LIPID PANEL
CHOLESTEROL/HDL RATIO SCREEN: 2.9 (ref 1.0–4.5)
CHOLESTEROL: 174 mg/dL (ref <=200)
HDL CHOLESTEROL: 59 mg/dL (ref 40–60)
LDL CHOLESTEROL CALCULATED: 97 mg/dL (ref 40–100)
NON-HDL CHOLESTEROL: 115 mg/dL (ref 70–130)
TRIGLYCERIDES: 88 mg/dL (ref 0–150)
VLDL CHOLESTEROL CAL: 17.6 mg/dL (ref 9–37)

## 2021-10-27 LAB — COMPREHENSIVE METABOLIC PANEL
ALBUMIN: 4.1 g/dL (ref 3.4–5.0)
ALKALINE PHOSPHATASE: 54 U/L (ref 46–116)
ALT (SGPT): 16 U/L (ref 10–49)
ANION GAP: 7 mmol/L (ref 3–11)
AST (SGOT): 20 U/L (ref <34)
BILIRUBIN TOTAL: 0.6 mg/dL (ref 0.3–1.2)
BLOOD UREA NITROGEN: 10 mg/dL (ref 9–23)
BUN / CREAT RATIO: 11
CALCIUM: 9.8 mg/dL (ref 8.7–10.4)
CHLORIDE: 104 mmol/L (ref 98–107)
CO2: 28 mmol/L (ref 20.0–31.0)
CREATININE: 0.93 mg/dL — ABNORMAL HIGH (ref 0.50–0.80)
EGFR CKD-EPI (2021) FEMALE: 76 mL/min/{1.73_m2} (ref >=60)
GLUCOSE RANDOM: 84 mg/dL (ref 70–99)
POTASSIUM: 3.9 mmol/L (ref 3.5–5.1)
PROTEIN TOTAL: 7.4 g/dL (ref 5.7–8.2)
SODIUM: 139 mmol/L (ref 136–145)

## 2021-10-27 LAB — IRON & TIBC
IRON SATURATION: 17 % — ABNORMAL LOW (ref 20–55)
IRON: 55 ug/dL (ref 50–170)
TOTAL IRON BINDING CAPACITY: 319 ug/dL (ref 250–425)

## 2021-10-27 LAB — FOLLICLE STIMULATING HORMONE: FOLLICLE STIMULATING HORMONE: 9.6 m[IU]/mL

## 2021-10-27 LAB — SEDIMENTATION RATE: ERYTHROCYTE SEDIMENTATION RATE: 22 mm/h — ABNORMAL HIGH (ref 0–20)

## 2021-10-27 LAB — CORTISOL: CORTISOL TOTAL: 6 ug/dL

## 2021-10-27 LAB — ESTRADIOL(ESTROGEN) LEVEL: ESTRADIOL LEVEL: 121 pg/mL

## 2021-10-27 LAB — TSH: THYROID STIMULATING HORMONE: 0.521 u[IU]/mL — ABNORMAL LOW (ref 0.550–4.780)

## 2021-10-27 LAB — T3: T3 TOTAL: 102.3 ng/dL (ref 60.0–180.0)

## 2021-10-27 LAB — VITAMIN B12: VITAMIN B-12: 401 pg/mL (ref 211–911)

## 2021-10-27 MED ORDER — ONDANSETRON 8 MG DISINTEGRATING TABLET
ORAL_TABLET | Freq: Every day | ORAL | 0 refills | 30 days | Status: CP
Start: 2021-10-27 — End: 2021-11-26

## 2021-10-27 NOTE — Unmapped (Signed)
Assessment/Plan:      Diagnosis ICD-10-CM Associated Orders   1. Morbid obesity with BMI of 45.0-49.9, adult (CMS-HCC)  E66.01     Z68.42       2. Metabolic syndrome  E88.81       3. 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 Nausea  R11.0       5. On long term drug therapy  Z79.899           Return in about 1 month (around 11/27/2021).    HPI:     Shelley Beck is a 46 y.o. year old Black/African American female who presents today for follow-up for weight loss.  Patient is a Engineer, civil (consulting), works night shift in the ED.  Has followed healthy diet and exercise routines for many years without much weight loss. Eats same amount today with less energy expended so he has gained weight.  Has not tried any weight loss medications previously.  BMI 41.63 235 pounds.  Patient has tried dietary and lifestyle modifications including calorie restrictions, weight loss behavior modifications, and increased physical activity for more than six months and patient has been unable to lose 5% of body weight.  It is medically necessary for patient to add pharmacotherapy to their obesity treatment plan since they have been unable to achieve clinically significant results with active enrollment and participation in lifestyle changes. Patient will continue lifestyle modifications in addition to starting pharmacotherapy to treat their obesity, including reduced calorie diet as specified below, increased physical activity as detailed below and behavioral changes.  Patient's weight prior to starting medication to treat their obesity was 257 pounds, pharmacotherapy is recommended following AACE guidelines.     Patient has administering Mounjaro 2.5 mg injectable every 7 days for 28 days.  She has lost 22 pounds.  Side effects includes dry mouth, some nausea and no appetite.  She has increased her daily water intake.  Discussed getting adequate amout of daily protein around 130-150g (0.7g per pound body weight) daily to ensure minimal muscle loss.  Zofran 8 mg SL sent to pharmacy for nausea prn.    ??  Diet  Eating 6 meals per day instead of 3 meals  Limiting intake to 1200-1600 calories per day  Intermittent fasting has been implemented  ??  Dietary goal: Recommended low carbohydrates, smaller portions, and decrease fast food to reduce calorie intake.  Maintain 1200-1600 calories per day.  Goal is 1-2 pounds weight loss per week.  Discussed weight loss plateau.  She will increase daily activity with walking per guidelines below.  Utilizing air fryer at home  ??  Physical Activity (Physical exercise program appropriate for age, physical condition, including expecatation for compliance, and recommendations discussed).  -Discussed need for at least 150 minutes of moderate aerobic activity weekly (including swimming, walking, jogging, biking, etc.  -Recommended exercise goal:  Recommend daily walking with increasing time/duration.  Goal is 60 minutes/day, as tolerated.  Recommend 5-7 days/week.  60-90 minutes of moderate intensity physical activity most days out of the week (5-7 days preferred).    ??  Behavioral Intervention (Specific strategies and tools for overcoming barriers and improving dietary compliance discussed).  -Rec. Behavioral goals:  Self-monitoring food intake (keep written or digital meal diary that tracks all meals, snacks and beverages), stress management, logbook of food intake and physical activity, social support.  Food chart with high protein foots listed provided to patient   ??  Pharmacotherapy: Will continue Mounjaro 5.0 mg/0.5 ml  Pnlj subcutaneous every days for 28 days.  Will titrate dose according to patient tolerability.  Medication side effects discussed.  Injection demonstration completed.  Patient able to self-inject appropriately.  ??  Will check baseline labs today to assess glucose, HgA1C, B12, Vitamin D, FSH, Estradiol, Cortisol, CBC, CMP/diff, Sed. CRP, Lipid panel, TSH, T3, T4.    Has complaints of continued chronic right knee pain from osteoarthritis.  She will continue wearing knee brace, RICE therapy, advil/tylenol combination and meloxicam prn.  ??  Follow up in 1 month for weight and diet, activity discussion   ??  The patient's medical record has been reviewed and the patient interviewed.????Previous imaging. labs??and culture reports reviewed.?? Total time spent reviewing chart, time with patient, decision making and planning??60??minutes????          Past Medical/Surgical History:     Past Medical History:   Diagnosis Date   ??? Anxiety     managed on lexapro   ??? HIV disease (CMS-HCC)    ??? Neck pain      Past Surgical History:   Procedure Laterality Date   ??? CHOLECYSTECTOMY     ??? nsvd      X3   ??? scoliosis repair     ??? uterine ablation         Social History:     Social History     Socioeconomic History   ??? Marital status: Divorced     Spouse name: None   ??? Number of children: None   ??? Years of education: None   ??? Highest education level: None   Tobacco Use   ??? Smoking status: Never   ??? Smokeless tobacco: Never   Vaping Use   ??? Vaping Use: Former   Substance and Sexual Activity   ??? Alcohol use: Yes     Comment: socially   ??? Drug use: No   ??? Sexual activity: Yes     Partners: Male       Family History:     Family History   Problem Relation Age of Onset   ??? No Known Problems Mother    ??? No Known Problems Father        Allergies:     Patient has no known allergies.    Current Medications:     Current Outpatient Medications   Medication Sig Dispense Refill   ??? albuterol HFA 90 mcg/actuation inhaler Inhale 2 puffs every four (4) hours as needed for wheezing or shortness of breath. 8 g 0   ??? ascorbic acid, vitamin C, (VITAMIN C) 1000 MG tablet Take 2 tablets (2,000 mg total) by mouth daily.     ??? bictegrav-emtricit-tenofov ala (BIKTARVY) 50-200-25 mg tablet Take 1 tablet by mouth daily. 30 tablet 11   ??? elderberry fruit and flower 460-115 mg cap Take by mouth.     ??? escitalopram oxalate (LEXAPRO) 20 MG tablet Take 1 tablet (20 mg total) by mouth daily. 90 tablet 1   ??? hydrOXYzine (ATARAX) 25 MG tablet TAKE 1 TO 2 TABLETS BY MOUTH THREE TIMES DAILY AS NEEDED FOR ANXIETY     ??? ibuprofen (MOTRIN) 800 MG tablet TAKE 1 TABLET BY MOUTH THREE TIMES DAILY FOR 10 DAYS     ??? metFORMIN (GLUCOPHAGE-XR) 750 MG 24 hr tablet      ??? ondansetron (ZOFRAN-ODT) 4 MG disintegrating tablet PLACE 1 TABLET ON THE TONGUE AND ALLOW TO DISSOLVE EVERY 8 HOURS AS NEEDED     ??? tirzepatide [facility]) 5 mg/0.5  mL PnIj Inject 5 mg under the skin every seven (7) days for 28 days. 2 mL 0   ??? zinc gluconate 50 mg (7 mg elemental zinc) tablet Take 1 tablet (50 mg total) by mouth daily.     ??? calcium carbonate (OS-CAL) 1,500 mg (600 mg elem calcium) tablet Take 1 tablet by mouth in the morning. (Patient not taking: Reported on 09/16/2021)       No current facility-administered medications for this visit.       ROS:     Constitutional: Negative for fever, chills, appetite change, fatigue and unexpected weight change.   HEENT:  Head: Negative for headache, head injury, dizziness, lightheadedness.  Eyes: Glasses or contact lenses.  Negative for pain, redness, excessive tearing, double or blurred vision, spots, specks, flashing lights, glaucoma, cataracts, photophobia.  Ears:  Negative for hearing change, tinnitus, vertigo, earache, infection, discharge.  Nose and sinuses: Negative for frequent colds, nasal stuffiness, discharge or itching, hay fever, nosebleeds.  Throat: Negative for dental abscess, bleeding gums, dentures, sore tongue, dry mouth, frequent sore throats, hoarseness.    Neck:  Negative for lumps, swollen glands, goiter, pain, stiffness  Respiratory: Negative for cough, sputum, hemoptysis, dyspnea, wheezing, pleurisy, last chest x-ray.  Negative for asthma, bronchitis, emphysema, pneumonia, tuberculosis.??   Cardiovascular: Negative for chest pain, palpitations, hypertension, rheumatic fever, heart murmurs, dyspnea, orthopnea, paroxysmal nocturnal dyspnea, edema, and leg swelling. Gastrointestinal: Negative for dysphagia, nausea, vomiting, abdominal pain, diarrhea, constipation, blood in stool, abdominal distention, heartburn, appetite change, food intolerance, and anal bleeding.   Endocrine: Negative for cold intolerance, heat intolerance, polydipsia, polyphagia and polyuria.   Genitourinary: Negative for dysuria, urgency, frequency, flank pain, difficulty urinating, hematuria, hesitancy, dribbling, difficulty starting/stopping stream, and genital sores.   Musculoskeletal: Negative for myalgias, back pain, joint swelling, arthralgias, gait problem and neck stiffness. ??  Skin: Negative for color change, pallor, rash, itching, and wound.   Allergic/Immunologic: Negative for environmental allergies and food allergies.   Neurological: Negative for dizziness, tremors, seizures, syncope, speech difficulty, weakness, light-headedness, numbness and headaches.  Negative for changes in mood, attention, orientation, or speech.   Hematological: Negative for adenopathy. Does not bruise/bleed easily.  Negative transfusion reactions.   Psychiatric/Behavioral: Negative for suicidal ideas, hallucinations, behavioral problems, confusion, sleep disturbance, self-injury, dyphoric mood and agitation. The patient is not nervous/anxious.??     Vital Signs:     Vitals:    10/27/21 1107   BP: 108/71   Pulse: 86   Resp: 16   Temp: 35.6 ??C (96.1 ??F)   SpO2: 97%     BP Readings from Last 3 Encounters:   10/27/21 108/71   09/16/21 119/74   09/14/21 110/70     Wt Readings from Last 3 Encounters:   10/27/21 (!) 106.6 kg (235 lb)   09/16/21 (!) 116.6 kg (257 lb)   09/14/21 (!) 111.9 kg (246 lb 12.8 oz)       Immunizations:     Immunization History   Administered Date(s) Administered   ??? Influenza Virus Vaccine, unspecified formulation 03/13/2017   ??? PPD Test 01/16/2017, 03/26/2020, 10/28/2020, 12/01/2020   ??? TdaP 10/28/2020       Labs:     Platelet   Date Value Ref Range Status   09/16/2021 272 150 - 450 10*9/L Final HGB   Date Value Ref Range Status   09/16/2021 12.3 11.3 - 14.9 g/dL Final     WBC   Date Value Ref Range Status   09/16/2021 9.2 3.6 -  11.2 10*9/L Final     BUN   Date Value Ref Range Status   09/16/2021 7 (L) 9 - 23 mg/dL Final     Creatinine   Date Value Ref Range Status   09/16/2021 0.84 (H) 0.50 - 0.80 mg/dL Final     ALT   Date Value Ref Range Status   09/16/2021 19 10 - 49 U/L Final     AST   Date Value Ref Range Status   09/16/2021 22 <34 U/L Final     Potassium   Date Value Ref Range Status   09/16/2021 3.6 3.5 - 5.1 mmol/L Final     CO2   Date Value Ref Range Status   09/16/2021 28.0 20.0 - 31.0 mmol/L Final     HIV RNA   Date Value Ref Range Status   11/24/2020 709 (H) <0 copies/mL Final     Hepatitis C Ab   Date Value Ref Range Status   10/24/2020 Nonreactive Nonreactive Final     Gonorrhoeae NAA   Date Value Ref Range Status   10/24/2020 Negative Negative Final     Chlamydia trachomatis, NAA   Date Value Ref Range Status   10/24/2020 Negative Negative Final     Urine Culture, Comprehensive   Date Value Ref Range Status   12/12/2018 50,000 to 100,000 CFU/mL Klebsiella pneumoniae (A)  Final   12/12/2018 <10,000 CFU/mL Normal Urogenital Flora  Final     CD4% (T Helper)   Date Value Ref Range Status   09/16/2021 42 34 - 58 % Final         Physical Exam:     General: Well-appearing, calm, well-groomed, well-nourished.  No distress  Eyes: PERRL. Extraocular muscles intact, sclera clear   ENT: Nares without discharge, oropharynx without lesions or exudate.   Neck: Supple, no thyromegaly, bruit, or JVD.   Lymph Nodes: No adenopathy (cervical, axillary)   Cardiovascular: RRR, S1 and S2 normal. No murmur, rub, or gallop.   Lungs: Clear to auscultation bilaterally without wheezes/crackles/rhonchi. Good air movement.   Skin: No rashes or lesions noted on limited exam. Warm and dry with no erythema or pallor.  Abdomen: Normoactive bowel sounds, abdomen soft, nontender, and nondistended, no hepatosplenomegaly or masses. Liver normal in size, no rebound or guarding.   Genitourinary: Deferred   Extremities: No cyanosis, clubbing or edema. No joint effusions, FROM without tenderness.   Musculoskeletal: No spine or costovertebral angle tenderness.   Neuro: Alert and oriented to person, place, and time. Cranial nerves II-XII grossly intact, normal gait, normal sensation throughout, normal cerebellar function.  Psychiatry: Alert and oriented to person, place, and time, conversant, appropriate affect.   Nursing notes and vitals reviewed

## 2021-10-28 LAB — HEMOGLOBIN A1C
ESTIMATED AVERAGE GLUCOSE: 120 mg/dL
HEMOGLOBIN A1C: 5.8 % — ABNORMAL HIGH (ref 4.8–5.6)

## 2021-10-28 LAB — C-REACTIVE PROTEIN: C-REACTIVE PROTEIN: 12 mg/L — ABNORMAL HIGH (ref <=10.0)

## 2021-10-29 DIAGNOSIS — R110 Nausea: Principal | ICD-10-CM

## 2021-10-29 DIAGNOSIS — E8881 Metabolic syndrome: Principal | ICD-10-CM

## 2021-10-30 LAB — VITAMIN D 1,25 DIHYDROXY: VITAMIN D 1,25-DIHYDROXY: 14 pg/mL — ABNORMAL LOW

## 2021-11-02 MED ORDER — WEGOVY 1 MG/0.5 ML SUBCUTANEOUS PEN INJECTOR
SUBCUTANEOUS | 1 refills | 0 days | Status: CP
Start: 2021-11-02 — End: ?

## 2021-11-09 DIAGNOSIS — Z717 Human immunodeficiency virus [HIV] counseling: Principal | ICD-10-CM

## 2021-11-09 DIAGNOSIS — R590 Localized enlarged lymph nodes: Principal | ICD-10-CM

## 2021-11-09 DIAGNOSIS — B20 Human immunodeficiency virus [HIV] disease: Principal | ICD-10-CM

## 2021-11-09 DIAGNOSIS — Z21 Asymptomatic human immunodeficiency virus [HIV] infection status: Principal | ICD-10-CM

## 2021-11-09 MED ORDER — BIKTARVY 50 MG-200 MG-25 MG TABLET
ORAL_TABLET | Freq: Every day | ORAL | 11 refills | 30 days | Status: CP
Start: 2021-11-09 — End: 2022-11-09
  Filled 2021-11-12: qty 30, 30d supply, fill #0

## 2021-11-09 NOTE — Unmapped (Signed)
[facility] Refill Coordination Note    Specialty Medication(s) to be Shipped:   Infectious Disease: Biktarvy    Other medication(s) to be shipped: No additional medications requested for fill at this time     Shelley Beck, DOB: 1976/05/20  Phone: [phone] (home)       All above HIPAA information was verified with patient's family member, [name].     Was a Nurse, learning disability used for this call? No    Completed refill call assessment today to schedule patient's medication shipment from the [facility] [phone]).  All relevant notes have been reviewed.     Specialty medication(s) and dose(s) confirmed: Regimen is correct and unchanged.   Changes to medications: Jameya reports no changes at this time.  Changes to insurance: No  New side effects reported not previously addressed with a pharmacist or physician: None reported  Questions for the pharmacist: No    Confirmed patient received a Conservation officer, historic buildings and a Surveyor, mining with first shipment. The patient will receive a drug information handout for each medication shipped and additional FDA Medication Guides as required.       DISEASE/MEDICATION-SPECIFIC INFORMATION        N/A    SPECIALTY MEDICATION ADHERENCE     Medication Adherence    Patient reported X missed doses in the last month: 0  Specialty Medication: Biktarvy  Patient is on additional specialty medications: No              Were doses missed due to medication being on hold? No    Biktarvy 50-200-25 mg: 10 days of medicine on hand        REFERRAL TO PHARMACIST     Referral to the pharmacist: Not needed      [facility]     Shipping address confirmed in Epic.     Delivery Scheduled: Yes, Expected medication delivery date: 11/13/21.  However, Rx request for refills was sent to the provider as there are none remaining.     Medication will be delivered via Next Day Courier to the prescription address in Epic WAM.    [name]   [facility] Specialty Technician

## 2021-11-18 DIAGNOSIS — Z6841 Body Mass Index (BMI) 40.0 and over, adult: Principal | ICD-10-CM

## 2021-11-18 DIAGNOSIS — E8881 Metabolic syndrome: Principal | ICD-10-CM

## 2021-11-18 MED ORDER — MOUNJARO 12.5 MG/0.5 ML SUBCUTANEOUS PEN INJECTOR
SUBCUTANEOUS | 0 refills | 28 days | Status: CP
Start: 2021-11-18 — End: 2021-12-16

## 2021-11-18 MED ORDER — MOUNJARO 15 MG/0.5 ML SUBCUTANEOUS PEN INJECTOR
SUBCUTANEOUS | 0 refills | 28 days | Status: CP
Start: 2021-11-18 — End: 2021-12-16

## 2021-11-18 MED ORDER — MOUNJARO 10 MG/0.5 ML SUBCUTANEOUS PEN INJECTOR
SUBCUTANEOUS | 0 refills | 28 days | Status: CP
Start: 2021-11-18 — End: 2021-12-16

## 2021-11-18 MED ORDER — MOUNJARO 7.5 MG/0.5 ML SUBCUTANEOUS PEN INJECTOR
SUBCUTANEOUS | 0 refills | 28 days | Status: CP
Start: 2021-11-18 — End: 2021-12-16

## 2021-11-18 NOTE — Unmapped (Signed)
Patient calls for tirzepatide [facility]) 5 mg/0.5 mL PnIj [[phone] refill. (11/18/2021 will coupon for medication be good for tomorrow?)

## 2021-11-30 ENCOUNTER — Ambulatory Visit: Admit: 2021-11-30 | Discharge: 2021-12-01 | Payer: PRIVATE HEALTH INSURANCE | Attending: Family | Primary: Family

## 2021-11-30 DIAGNOSIS — R110 Nausea: Principal | ICD-10-CM

## 2021-11-30 DIAGNOSIS — E8881 Metabolic syndrome: Principal | ICD-10-CM

## 2021-11-30 DIAGNOSIS — Z6841 Body Mass Index (BMI) 40.0 and over, adult: Principal | ICD-10-CM

## 2021-11-30 DIAGNOSIS — G8929 Other chronic pain: Principal | ICD-10-CM

## 2021-11-30 DIAGNOSIS — M25562 Pain in left knee: Principal | ICD-10-CM

## 2021-11-30 DIAGNOSIS — M7122 Synovial cyst of popliteal space [Baker], left knee: Principal | ICD-10-CM

## 2021-11-30 NOTE — Unmapped (Signed)
Assessment/Plan:      Diagnosis ICD-10-CM Associated Orders   1. Chronic pain of left knee  M25.562     G89.29       2. Synovial cyst of popliteal space ([place]), left knee  M71.22       3. Nausea  R11.0       4. Morbid obesity with BMI of 45.0-49.9, adult (CMS-HCC)  E66.01     Z68.42       5. Insulin resistance  E88.81       6. Metabolic syndrome  E88.81           Return for Next scheduled follow up.    HPI:     Shelley Beck is a 46 y.o. year old Black/African American female who presents today for follow-up for chronic left knee pain and weight loss. Patient is a Engineer, civil (consulting), works night shift in the ED. Has struggled with left knee pain for many months, pain 7/10 this morning. She has started using a single prod cane at work and at home. Has seen [facility] recently and received cortisone injection with no relief of pain. She has used topical voltaren and OTC tylenol/Ibuprofen combination. She follow RICE therapy at home. She plans to see another Orthopedic in hopes on MRI.     Has followed healthy diet and exercise routines for many years without much weight loss. Eats same amount today with less energy expended so he has gained weight.  Has not tried any weight loss medications previously.  BMI 41.45 234 pounds.  Patient has tried dietary and lifestyle modifications including calorie restrictions, weight loss behavior modifications, and increased physical activity for more than six months and patient has been unable to lose 5% of body weight.  It is medically necessary for patient to add pharmacotherapy to their obesity treatment plan since they have been unable to achieve clinically significant results with active enrollment and participation in lifestyle changes. Patient will continue lifestyle modifications in addition to starting pharmacotherapy to treat their obesity, including reduced calorie diet as specified below, increased physical activity as detailed below and behavioral changes.  Patient's weight prior to starting medication to treat their obesity was 257 pounds, pharmacotherapy is recommended following AACE guidelines.      Patient has administering Mounjaro 5 mg/0.5 ml injectable every 7 days for 28 days.  She has lost 22 pounds and has stalled these last few weeks. Side effects includes dry mouth, some nausea and no appetite.  She has increased her daily water intake.  Discussed getting adequate amout of daily protein around 130-150g (0.7g per pound body weight) daily to ensure minimal muscle loss. She has not been eating enough daily. She will focus on making good food choices and eating on a regular schedule.        Diet  Eating 6 meals per day instead of 3 meals  Limiting intake to 1200-1600 calories per day  Intermittent fasting has been implemented     Dietary goal: Recommended low carbohydrates, smaller portions, and decrease fast food to reduce calorie intake.  Maintain 1200-1600 calories per day.  Goal is 1-2 pounds weight loss per week.  Discussed weight loss plateau.  She will increase daily activity with walking per guidelines below.  Utilizing air fryer at home     Physical Activity (Physical exercise program appropriate for age, physical condition, including expecatation for compliance, and recommendations discussed).  -Discussed need for at least 150 minutes of moderate aerobic activity weekly (including swimming, walking,  jogging, biking, etc.  -Recommended exercise goal:  Recommend daily walking with increasing time/duration.  Goal is 60 minutes/day, as tolerated.  Recommend 5-7 days/week.  60-90 minutes of moderate intensity physical activity most days out of the week (5-7 days preferred).       Behavioral Intervention (Specific strategies and tools for overcoming barriers and improving dietary compliance discussed).  -Rec. Behavioral goals:  Self-monitoring food intake (keep written or digital meal diary that tracks all meals, snacks and beverages), stress management, logbook of food intake and physical activity, social support.  Food chart with high protein foots listed provided to patient      Pharmacotherapy: Will start Mounjaro 7.5 mg/0.5 ml Pnlj subcutaneous every days for 28 days.  Will titrate dose according to patient tolerability.  Medication side effects discussed.  Injection demonstration completed.  Patient able to self-inject appropriately.     CRP 12 Sed 22 A1C 5.8% Vit D low 14 and low iron saturation. Patient will increase dietary intake of iron, consider adding Vitamin C     Follow up in 1 month for weight and diet, activity discussion      The patient's medical record has been reviewed and the patient interviewed.  Previous imaging. labs and culture reports reviewed.  Total time spent reviewing chart, time with patient, decision making and planning 30 minutes      Past Medical/Surgical History:     Past Medical History:   Diagnosis Date    Anxiety     managed on lexapro    HIV disease (CMS-HCC)     Neck pain      Past Surgical History:   Procedure Laterality Date    CHOLECYSTECTOMY      nsvd      X3    scoliosis repair      uterine ablation         Social History:     Social History     Socioeconomic History    Marital status: Divorced     Spouse name: None    Number of children: None    Years of education: None    Highest education level: None   Tobacco Use    Smoking status: Never    Smokeless tobacco: Never   Vaping Use    Vaping Use: Former   Substance and Sexual Activity    Alcohol use: Yes     Comment: socially    Drug use: No    Sexual activity: Yes     Partners: Male       Family History:     Family History   Problem Relation Age of Onset    No Known Problems Mother     No Known Problems Father        Allergies:     Patient has no known allergies.    Current Medications:     Current Outpatient Medications   Medication Sig Dispense Refill    albuterol HFA 90 mcg/actuation inhaler Inhale 2 puffs every four (4) hours as needed for wheezing or shortness of breath. 8 g 0 ascorbic acid, vitamin C, (VITAMIN C) 1000 MG tablet Take 2 tablets (2,000 mg total) by mouth daily.      bictegrav-emtricit-tenofov ala (BIKTARVY) 50-200-25 mg tablet Take 1 tablet by mouth daily. 30 tablet 11    escitalopram oxalate (LEXAPRO) 20 MG tablet Take 1 tablet (20 mg total) by mouth daily. 90 tablet 1    hydrOXYzine (ATARAX) 25 MG tablet TAKE 1 TO 2 TABLETS  BY MOUTH THREE TIMES DAILY AS NEEDED FOR ANXIETY      ibuprofen (MOTRIN) 800 MG tablet TAKE 1 TABLET BY MOUTH THREE TIMES DAILY FOR 10 DAYS      metFORMIN (GLUCOPHAGE-XR) 750 MG 24 hr tablet       tirzepatide (MOUNJARO) 10 mg/0.5 mL PnIj Inject 10 mg under the skin every seven (7) days for 28 days. 2 mL 0    tirzepatide (MOUNJARO) 12.5 mg/0.5 mL PnIj Inject 12.5 mg under the skin every seven (7) days for 28 days. 2 mL 0    tirzepatide (MOUNJARO) 15 mg/0.5 mL PnIj Inject 15 mg under the skin every seven (7) days for 28 days. 2 mL 0    tirzepatide (MOUNJARO) 7.5 mg/0.5 mL PnIj Inject 7.5 mg under the skin every seven (7) days for 28 days. 2 mL 0    tiZANidine (ZANAFLEX) 4 MG tablet TAKE 1 TABLET BY MOUTH EVERY 6 HOURS FOR 10 DAYS      calcium carbonate (OS-CAL) 1,500 mg (600 mg elem calcium) tablet Take 1 tablet by mouth in the morning. (Patient not taking: Reported on 09/16/2021)       No current facility-administered medications for this visit.       ROS:     Constitutional: Negative for fever, chills, appetite change, fatigue and unexpected weight change.   HEENT:  Head: Negative for headache, head injury, dizziness, lightheadedness.  Eyes: Glasses or contact lenses.  Negative for pain, redness, excessive tearing, double or blurred vision, spots, specks, flashing lights, glaucoma, cataracts, photophobia.  Ears:  Negative for hearing change, tinnitus, vertigo, earache, infection, discharge.  Nose and sinuses: Negative for frequent colds, nasal stuffiness, discharge or itching, hay fever, nosebleeds.  Throat: Negative for dental abscess, bleeding gums, dentures, sore tongue, dry mouth, frequent sore throats, hoarseness.    Neck:  Negative for lumps, swollen glands, goiter, pain, stiffness  Respiratory: Negative for cough, sputum, hemoptysis, dyspnea, wheezing, pleurisy, last chest x-ray.  Negative for asthma, bronchitis, emphysema, pneumonia, tuberculosis.    Cardiovascular: Negative for chest pain, palpitations, hypertension, rheumatic fever, heart murmurs, dyspnea, orthopnea, paroxysmal nocturnal dyspnea, edema, and leg swelling.   Gastrointestinal: Negative for dysphagia, nausea, vomiting, abdominal pain, diarrhea, constipation, blood in stool, abdominal distention, heartburn, appetite change, food intolerance, and anal bleeding.   Endocrine: Negative for cold intolerance, heat intolerance, polydipsia, polyphagia and polyuria.   Genitourinary: Negative for dysuria, urgency, frequency, flank pain, difficulty urinating, hematuria, hesitancy, dribbling, difficulty starting/stopping stream, and genital sores.   Musculoskeletal: Negative for myalgias, back pain, joint swelling, arthralgias, gait problem and neck stiffness.    Skin: Negative for color change, pallor, rash, itching, and wound.   Allergic/Immunologic: Negative for environmental allergies and food allergies.   Neurological: Negative for dizziness, tremors, seizures, syncope, speech difficulty, weakness, light-headedness, numbness and headaches.  Negative for changes in mood, attention, orientation, or speech.   Hematological: Negative for adenopathy. Does not bruise/bleed easily.  Negative transfusion reactions.   Psychiatric/Behavioral: Negative for suicidal ideas, hallucinations, behavioral problems, confusion, sleep disturbance, self-injury, dyphoric mood and agitation. The patient is not nervous/anxious.      Vital Signs:     Vitals:    11/30/21 0805   BP: 128/77   Pulse: 70   Resp: 16   Temp: 36.4 ??C (97.6 ??F)   SpO2: 99%     BP Readings from Last 3 Encounters:   11/30/21 128/77   10/27/21 108/71 09/16/21 119/74     Wt Readings from Last 3 Encounters:  11/30/21 (!) 106.1 kg (234 lb)   10/27/21 (!) 106.6 kg (235 lb)   09/16/21 (!) 116.6 kg (257 lb)       Immunizations:     Immunization History   Administered Date(s) Administered    Influenza Virus Vaccine, unspecified formulation 03/13/2017    PPD Test 01/16/2017, 03/26/2020, 10/28/2020, 12/01/2020    TdaP 10/28/2020       Labs:     Platelet   Date Value Ref Range Status   10/27/2021 326 150 - 450 10*9/L Final     HGB   Date Value Ref Range Status   10/27/2021 12.9 11.3 - 14.9 g/dL Final     WBC   Date Value Ref Range Status   10/27/2021 4.7 3.6 - 11.2 10*9/L Final     BUN   Date Value Ref Range Status   10/27/2021 10 9 - 23 mg/dL Final     Creatinine   Date Value Ref Range Status   10/27/2021 0.93 (H) 0.50 - 0.80 mg/dL Final     ALT   Date Value Ref Range Status   10/27/2021 16 10 - 49 U/L Final     AST   Date Value Ref Range Status   10/27/2021 20 <34 U/L Final     Potassium   Date Value Ref Range Status   10/27/2021 3.9 3.5 - 5.1 mmol/L Final     CO2   Date Value Ref Range Status   10/27/2021 28.0 20.0 - 31.0 mmol/L Final     HIV RNA   Date Value Ref Range Status   11/24/2020 709 (H) <0 copies/mL Final     Hepatitis C Ab   Date Value Ref Range Status   10/24/2020 Nonreactive Nonreactive Final     Gonorrhoeae NAA   Date Value Ref Range Status   10/24/2020 Negative Negative Final     Chlamydia trachomatis, NAA   Date Value Ref Range Status   10/24/2020 Negative Negative Final     Urine Culture, Comprehensive   Date Value Ref Range Status   12/12/2018 50,000 to 100,000 CFU/mL Klebsiella pneumoniae (A)  Final   12/12/2018 <10,000 CFU/mL Normal Urogenital Flora  Final     CD4% (T Helper)   Date Value Ref Range Status   09/16/2021 42 34 - 58 % Final         Physical Exam:     General: Well-appearing, calm, well-groomed, well-nourished.  No distress  Eyes: PERRL. Extraocular muscles intact, sclera clear   ENT: Nares without discharge, oropharynx without lesions or exudate.   Neck: Supple, no thyromegaly, bruit, or JVD.   Lymph Nodes: No adenopathy (cervical, axillary)   Cardiovascular: RRR, S1 and S2 normal. No murmur, rub, or gallop.   Lungs: Clear to auscultation bilaterally without wheezes/crackles/rhonchi. Good air movement.   Skin: No rashes or lesions noted on limited exam. Warm and dry with no erythema or pallor.  Abdomen: Normoactive bowel sounds, abdomen soft, nontender, and nondistended, no hepatosplenomegaly or masses. Liver normal in size, no rebound or guarding.   Genitourinary: Deferred   Extremities: No cyanosis, clubbing or edema. No joint effusions, FROM without tenderness.   Musculoskeletal: No spine or costovertebral angle tenderness.   Neuro: Alert and oriented to person, place, and time. Cranial nerves II-XII grossly intact, normal gait, normal sensation throughout, normal cerebellar function.  Psychiatry: Alert and oriented to person, place, and time, conversant, appropriate affect.   Nursing notes and vitals reviewed

## 2021-12-02 NOTE — Unmapped (Signed)
Shelley Beck called and wants to know if you can write her prescription for escitalopram oxalate (LEXAPRO) 20 MG tablet just this time.

## 2021-12-03 ENCOUNTER — Ambulatory Visit: Admit: 2021-12-03 | Discharge: 2021-12-04 | Payer: PRIVATE HEALTH INSURANCE

## 2021-12-03 DIAGNOSIS — G8929 Other chronic pain: Principal | ICD-10-CM

## 2021-12-03 DIAGNOSIS — W57XXXA Bitten or stung by nonvenomous insect and other nonvenomous arthropods, initial encounter: Principal | ICD-10-CM

## 2021-12-03 DIAGNOSIS — M25562 Pain in left knee: Principal | ICD-10-CM

## 2021-12-03 DIAGNOSIS — Z1331 Encounter for screening for depression: Principal | ICD-10-CM

## 2021-12-03 MED ADMIN — predniSONE (DELTASONE) tablet 60 mg: 60 mg | ORAL | @ 17:00:00 | Stop: 2021-12-03

## 2021-12-03 MED ADMIN — cetirizine (ZyrTEC) tablet 10 mg: 10 mg | ORAL | @ 17:00:00 | Stop: 2021-12-03

## 2021-12-03 NOTE — Unmapped (Addendum)
As we discussed, I put a referral in with [facility] sports medicine.  You may call them anytime to get their next available appointment to further evaluate your knee pain.    You do have a bug bite on the right lower arm that is very swollen.  As we discussed it is too soon for this to be infected.  The local inflammatory response to some bites is stronger than others.  We have done a one-time dose of an oral steroid here in the clinic.    I have sent you home with a steroid Dosepak they will start tomorrow.  That will help with the itching, swelling as well as your knee pain.  Make sure to take it with food and water to avoid stomach upset and early in the days it does not keep you up at night.    If after 3 days the swelling is not coming down or there is any dramatic worsening you may fill and start the prescribed antibiotic.  If things are improving as expected please rip up and throw a the prescription.

## 2021-12-03 NOTE — Unmapped (Signed)
Assessment/Plan:        Diagnoses and all orders for this visit:    Bug bite, initial encounter  -     predniSONE (DELTASONE) tablet 60 mg  -     cetirizine (ZyrTEC) tablet 10 mg  -     cephalexin (KEFLEX) 500 MG capsule; Take 1 capsule (500 mg total) by mouth four (4) times a day for 7 days.    Negative depression screening    Chronic pain of left knee  -     predniSONE (DELTASONE) 10 mg tablet pack; Take by mouth daily for 6 days. Day 1 60mg , Day 2 50mg , Day 3 40mg , Day 4 30mg , Day 5 20mg , Day 6 10mg .  -     Ambulatory referral to Orthopedic Surgery; Future    She overall appears well, vital stable, lungs clear to auscultation.  Bug bite consistent with localized inflammatory reaction.  Did discuss with patient that certain bugs can cause a larger laboratory reaction the others and that can increase over 48 to 72 hours and is very unlikely to be bacterial this early.  We will provide a watch and wait prescription for antibiotics as patient works in healthcare and after discussion on risk versus benefit does feel comfortable monitoring this and if the swelling does not improve in the next few days or seems to get worse she will start the antibiotic.    After exam of knee, did discuss with patient chronic knee pain and management.  I am not able to view any of her prior x-rays.  Did discuss that she would likely need further management that can be provided here in urgent care and did recommend follow-up with orthopedic sports medicine clinic which she is agreeable to.  We will do a short course of oral steroids which will help both with the acute inflammation from the bug bite and potentially short-term for the knee pain.  Patient feels comfortable with this and has no additional questions at this time.      PHQ-2 Score: 0    PHQ-9 Score:      Edinburgh Score:      Screening complete, no depression identified / no further action needed today    Subjective:     Patient ID: Shelley Beck is a 46 y.o. female.    Patient reports that about 3 months ago she started having pain with her left knee.  She was seen at [facility] [place] twice and had x-rays done.  She was told it was just arthritis however the pain is getting worse and she is having to use her cane.  She does walk frequently for work and its becoming problematic.  Feels that she likely needs an MRI.  No known trauma to her knee.  Is having to take Tylenol 4 times a day with minimal relief of symptoms.    Patient also was working in the garden yesterday and got a bug bite on her right lower arm and left chest.  States her right arm is very swollen at this time and red.  Ice only provides temporary relief.  No fever.  Feels generally well otherwise.  Not sure what bit her.      The following portions of the patient's history were reviewed and updated as appropriate: allergies, current medications, past family history, past medical history, past social history, past surgical history, and problem list.      Review of Systems   Constitutional:  Negative for chills and fever.  Musculoskeletal:  Positive for joint swelling (Left knee pain and swelling).   Skin:  Positive for wound (Bite to right lower arm is very swollen).         Objective:    Physical Exam  Vitals and nursing note reviewed.   Constitutional:       General: She is not in acute distress.     Appearance: Normal appearance.   HENT:      Head: Normocephalic.   Cardiovascular:      Rate and Rhythm: Normal rate and regular rhythm.      Heart sounds: Normal heart sounds.   Pulmonary:      Effort: Pulmonary effort is normal.      Breath sounds: Normal breath sounds.   Chest:       Musculoskeletal:        Arms:       Right upper leg: Normal.      Left upper leg: Normal.      Right knee: Normal.      Left knee: Effusion present. No swelling, deformity, erythema, ecchymosis or lacerations. Decreased range of motion. Tenderness present over the medial joint line and MCL. No lateral joint line tenderness. Left lower leg: Normal.   Neurological:      Mental Status: She is alert.

## 2021-12-04 MED ORDER — PREDNISONE 10 MG TABLETS IN A DOSE PACK
ORAL_TABLET | Freq: Every day | ORAL | 0 refills | 6 days | Status: CP
Start: 2021-12-04 — End: 2021-12-10

## 2021-12-04 NOTE — Unmapped (Signed)
[facility] Refill Coordination Note    Specialty Medication(s) to be Shipped:   Infectious Disease: Biktarvy    Other medication(s) to be shipped: No additional medications requested for fill at this time     Shelley Beck, DOB: 1975-07-13  Phone: [phone] (home)       All above HIPAA information was verified with patient's family member, [name].     Was a Nurse, learning disability used for this call? No    Completed refill call assessment today to schedule patient's medication shipment from the [facility] [phone]).  All relevant notes have been reviewed.     Specialty medication(s) and dose(s) confirmed: Regimen is correct and unchanged.   Changes to medications: Syesha reports no changes at this time.  Changes to insurance: No  New side effects reported not previously addressed with a pharmacist or physician: None reported  Questions for the pharmacist: No    Confirmed patient received a Conservation officer, historic buildings and a Surveyor, mining with first shipment. The patient will receive a drug information handout for each medication shipped and additional FDA Medication Guides as required.       DISEASE/MEDICATION-SPECIFIC INFORMATION        N/A    SPECIALTY MEDICATION ADHERENCE     Medication Adherence    Patient reported X missed doses in the last month: 0  Specialty Medication: Biktarvy  Patient is on additional specialty medications: No  Any gaps in refill history greater than 2 weeks in the last 3 months: no  Demonstrates understanding of importance of adherence: yes  Informant: spouse  Reliability of informant: reliable  Confirmed plan for next specialty medication refill: delivery by pharmacy  Refills needed for supportive medications: not needed              Were doses missed due to medication being on hold? No    Biktarvy 50-200-25 mg: 10 days of medicine on hand        REFERRAL TO PHARMACIST     Referral to the pharmacist: Not needed      [facility]     Shipping address confirmed in Epic.     Delivery Scheduled: Yes, Expected medication delivery date: 12/10/2021.     Medication will be delivered via Next Day Courier to the prescription address in Epic WAM.    Margeret Stachnik D Firmin Belisle   [facility] Specialty Technician

## 2021-12-09 DIAGNOSIS — B20 Human immunodeficiency virus [HIV] disease: Principal | ICD-10-CM

## 2021-12-09 DIAGNOSIS — R590 Localized enlarged lymph nodes: Principal | ICD-10-CM

## 2021-12-09 DIAGNOSIS — Z717 Human immunodeficiency virus [HIV] counseling: Principal | ICD-10-CM

## 2021-12-09 DIAGNOSIS — Z21 Asymptomatic human immunodeficiency virus [HIV] infection status: Principal | ICD-10-CM

## 2021-12-09 MED FILL — BIKTARVY 50 MG-200 MG-25 MG TABLET: ORAL | 30 days supply | Qty: 30 | Fill #1

## 2021-12-11 ENCOUNTER — Ambulatory Visit
Admit: 2021-12-11 | Discharge: 2021-12-12 | Payer: PRIVATE HEALTH INSURANCE | Attending: Orthopaedic Surgery | Primary: Orthopaedic Surgery

## 2021-12-11 ENCOUNTER — Ambulatory Visit: Admit: 2021-12-11 | Discharge: 2021-12-12 | Payer: PRIVATE HEALTH INSURANCE

## 2021-12-11 DIAGNOSIS — G8929 Other chronic pain: Principal | ICD-10-CM

## 2021-12-11 DIAGNOSIS — R110 Nausea: Principal | ICD-10-CM

## 2021-12-11 DIAGNOSIS — Z6841 Body Mass Index (BMI) 40.0 and over, adult: Principal | ICD-10-CM

## 2021-12-11 DIAGNOSIS — E8881 Metabolic syndrome: Principal | ICD-10-CM

## 2021-12-11 DIAGNOSIS — M25562 Pain in left knee: Principal | ICD-10-CM

## 2021-12-11 DIAGNOSIS — Z79899 Other long term (current) drug therapy: Principal | ICD-10-CM

## 2021-12-11 MED ORDER — ONDANSETRON 8 MG DISINTEGRATING TABLET
ORAL_TABLET | Freq: Three times a day (TID) | ORAL | 0 refills | 20 days | Status: CP | PRN
Start: 2021-12-11 — End: 2022-01-08

## 2021-12-11 NOTE — Unmapped (Signed)
Patient is a 46 year old female with left knee pain, likely from meniscus tear. MRI of the left knee ordered for further assessment. Follow up in the office 3-5 days after the exam for further assessment.  If she is unable to have MRI due to metal in her back then will order CT arthrogram

## 2021-12-11 NOTE — Unmapped (Signed)
HPI: Patient is 46 year old female here for left knee pain with sudden onset. She denies any injury to the left knee. Patient points to the back of her knee as the primary location of the pain.  Pain is constant, 5/10, described as aching and sharp. Activities such as standing, walking and stairs makes the pain worse. She describes a feeling of clicking in the left knee. Patient takes Ibuprofen as needed for the pain. Patient works as an Systems analyst. She was previously seen by [facility] about a month ago for the left knee and received a steroid injection, hinged knee brace, oral steroids and a muscle relaxer. Lise Auer, FNP note reviewed form 11/30/2021.    I have reviewed past medical, surgical, social and family history, medications and allergies as documented in the EMR.      Review of Systems:  A comprehensive 12+ review of systems was negative unless otherwise stated in the HPI.     Physical Exam:   Vitals:    12/11/21 1041   BP: 120/81   Pulse: 86     Vitals:    12/11/21 1041   Weight: (!) 104.8 kg (231 lb)     Body mass index is 40.92 kg/m??.     Constitutional: In no acute distress  Respiratory: No labored breathing and no audible wheezing     Left Knee Exam:   Ambulates with non antalgic gait  Back is non tender  Hip with pain free ROM  Skin intact  No swelling, ecchymosis, or erythema  Tender to palpation medial joint line  ROM: 0-125  5/5 Hamsting and quad strength  No pain with calf squeeze  Stable to anterior, posterior, varus and valgus stress  Lower extremity motor and neuro exam:  gastroc-soleus complex/tibialis anterior/ and extensor hallucis longus motor intact; sensation intact to light touch saphenous/sural/ superficial peroneal/ deep peroneal/ and tibial nerve distribution, warm and well perfused    DP palpable      Imaging:     XR results from [facility] for the left knee reviewed read:  mild chondromalacia of the knee    XR left knee 3 views AP, lateral and bilateral merchant: shows medial compartment joint space narrowing to 4 mm with small lateral and patellar based osteophytes. No fracture, dislocation, or masses noted      A/P: Patient is a 46 year old female with left knee pain, likely from meniscus tear. MRI of the left knee ordered for further assessment. Follow up in the office 3-5 days after the exam for further assessment.  If she is unable to have MRI due to metal in her back then will order CT arthrogram    I attest that I, Gerilyn Pilgrim, personally documented this note while acting as scribe for Annitta Jersey, MD.      Gerilyn Pilgrim, Scribe.  12/11/2021     The documentation recorded by the scribe accurately reflects the service I personally performed and the decisions made by me.    Annitta Jersey, MD

## 2021-12-18 ENCOUNTER — Ambulatory Visit: Admit: 2021-12-18 | Discharge: 2021-12-19 | Payer: PRIVATE HEALTH INSURANCE

## 2022-01-08 NOTE — Unmapped (Signed)
[facility] Refill Coordination Note    Specialty Medication(s) to be Shipped:   Infectious Disease: Biktarvy    Other medication(s) to be shipped: No additional medications requested for fill at this time     Shanetria Luma, DOB: 06-Oct-1975  Phone: [phone] (home)       All above HIPAA information was verified with patient.     Was a Nurse, learning disability used for this call? No    Completed refill call assessment today to schedule patient's medication shipment from the [facility] [phone]).  All relevant notes have been reviewed.     Specialty medication(s) and dose(s) confirmed: Regimen is correct and unchanged.   Changes to medications: Malaija reports no changes at this time.  Changes to insurance: No  New side effects reported not previously addressed with a pharmacist or physician: None reported  Questions for the pharmacist: No    Confirmed patient received a Conservation officer, historic buildings and a Surveyor, mining with first shipment. The patient will receive a drug information handout for each medication shipped and additional FDA Medication Guides as required.       DISEASE/MEDICATION-SPECIFIC INFORMATION        N/A    SPECIALTY MEDICATION ADHERENCE     Medication Adherence    Patient reported X missed doses in the last month: 0  Specialty Medication: biktarvy              Were doses missed due to medication being on hold? No    biktarvy  : unable to confirm quantity on hand    REFERRAL TO PHARMACIST     Referral to the pharmacist: Not needed      [facility]     Shipping address confirmed in Epic.     Delivery Scheduled: Yes, Expected medication delivery date: 7/25.     Medication will be delivered via Next Day Courier to the prescription address in Epic WAM.    [name]   [facility] Specialty Technician

## 2022-01-11 MED FILL — BIKTARVY 50 MG-200 MG-25 MG TABLET: ORAL | 30 days supply | Qty: 30 | Fill #2

## 2022-01-14 ENCOUNTER — Ambulatory Visit
Admit: 2022-01-14 | Discharge: 2022-01-15 | Payer: PRIVATE HEALTH INSURANCE | Attending: Orthopaedic Surgery | Primary: Orthopaedic Surgery

## 2022-01-14 DIAGNOSIS — M25562 Pain in left knee: Principal | ICD-10-CM

## 2022-01-14 DIAGNOSIS — G8929 Other chronic pain: Principal | ICD-10-CM

## 2022-01-14 NOTE — Unmapped (Signed)
Patient is a 46 year old female with left knee pain, likely DJD and medial meniscus tear. Has not responded to NSAIDs and steroid injection.  Risks, benefits and alternatives discussed.  She is indicated for a  left knee arthroscopic partial medial meniscectomy

## 2022-01-14 NOTE — Unmapped (Signed)
HPI: Patient is 46 year old female here for left knee pain with sudden onset. She denies any injury to the left knee. Patient points to the back of her knee as the primary location of the pain.  Pain is constant, 5/10, described as aching and sharp. Activities such as standing, walking and stairs makes the pain worse. She describes a feeling of clicking in the left knee. Patient takes Ibuprofen as needed for the pain. Patient works as an Systems analyst. She was previously seen by [facility] about a month ago for the left knee and received a steroid injection, hinged knee brace, oral steroids and a muscle relaxer.    Here for MRI follow up,  continued pain.      I have reviewed past medical, surgical, social and family history, medications and allergies as documented in the EMR.      Review of Systems:  A comprehensive 12+ review of systems was negative unless otherwise stated in the HPI.      Physical Exam:             Body mass index is 40.92 kg/m??.      Constitutional: In no acute distress  Respiratory: No labored breathing and no audible wheezing      Left Knee Exam:   Ambulates with non antalgic gait  Back is non tender  Hip with pain free ROM  Skin intact  No swelling, ecchymosis, or erythema  Tender to palpation medial joint line  ROM: 0-125  5/5 Hamsting and quad strength  No pain with calf squeeze  Stable to anterior, posterior, varus and valgus stress  Lower extremity motor and neuro exam:  gastroc-soleus complex/tibialis anterior/ and extensor hallucis longus motor intact; sensation intact to light touch saphenous/sural/ superficial peroneal/ deep peroneal/ and tibial nerve distribution, warm and well perfused     DP palpable        Imaging:      MRI [facility] from 12/18/21  1.    Degenerated free edge oblique tear medial meniscus posterior horn with candidate for superior peripheral flap along the posterior root as described. Additional small chronic horizontal tear of the body. No extrusion.  2.    Medial and patellofemoral predominant osteoarthrosis with focal high-grade chondral defects as described. High-grade chondral fissuring of the patella with equivocal nondisplaced chondral flap formation.  3.    Mild subchondral edema of the medial femoral condyle, which may be degenerative or mild osseous contusion.  4.    Grade 1 sprain of medial collateral ligament.  5.    Early mucoid degeneration of anterior cruciate ligament.  6.    Small joint effusion and synovitis.  7.    Mild scattered tendinopathy as above.         A/P: Patient is a 46 year old female with left knee pain, likely DJD and medial meniscus tear. Has not responded to NSAIDs and steroid injection.  Risks, benefits and alternatives discussed.  She is indicated for a  left knee arthroscopic partial medial meniscectomy

## 2022-01-15 NOTE — Unmapped (Signed)
[employer]. SHE WILL CALL BACK NEXT WEEK TO RESCHEDULE.

## 2022-02-02 NOTE — Unmapped (Signed)
Spoke with patient and scheduled left knee arthroscopy for 03/02/22 with Dr. Roselie Awkward at [facility]. Informed patient the hospital will contact her with pre op instructions and time to arrive on day of surgery. Will send clearance form to Lise Auer FNP for surgery clearance.

## 2022-02-03 DIAGNOSIS — M25562 Pain in left knee: Principal | ICD-10-CM

## 2022-02-03 DIAGNOSIS — G8929 Other chronic pain: Principal | ICD-10-CM

## 2022-02-04 NOTE — Unmapped (Signed)
[facility] Clinical Assessment & Refill Coordination Note    Margalit Eisenbeis, [place]: 1975/12/25  Phone: [phone] (home)     All above HIPAA information was verified with patient's family member, [name] (domestic partner).     Was a Nurse, learning disability used for this call? No    Specialty Medication(s):   Infectious Disease: Biktarvy     Current Outpatient Medications   Medication Sig Dispense Refill    albuterol HFA 90 mcg/actuation inhaler Inhale 2 puffs every four (4) hours as needed for wheezing or shortness of breath. 8 g 0    ascorbic acid, vitamin C, (VITAMIN C) 1000 MG tablet Take 2 tablets (2,000 mg total) by mouth daily.      bictegrav-emtricit-tenofov ala (BIKTARVY) 50-200-25 mg tablet Take 1 tablet by mouth daily. 30 tablet 11    calcium carbonate (OS-CAL) 1,500 mg (600 mg elem calcium) tablet Take 1 tablet (600 mg of elem calcium total) by mouth daily.      escitalopram oxalate (LEXAPRO) 20 MG tablet Take 1 tablet (20 mg total) by mouth daily. 90 tablet 1    hydrOXYzine (ATARAX) 25 MG tablet TAKE 1 TO 2 TABLETS BY MOUTH THREE TIMES DAILY AS NEEDED FOR ANXIETY      ibuprofen (MOTRIN) 800 MG tablet TAKE 1 TABLET BY MOUTH THREE TIMES DAILY FOR 10 DAYS      metFORMIN (GLUCOPHAGE-XR) 750 MG 24 hr tablet       tiZANidine (ZANAFLEX) 4 MG tablet TAKE 1 TABLET BY MOUTH EVERY 6 HOURS FOR 10 DAYS       No current facility-administered medications for this visit.        Changes to medications:  no changes    No Known Allergies    Changes to allergies: No    SPECIALTY MEDICATION ADHERENCE     Biktarvy 50-200-25 mg: unable to verify the number of  days of medicine on hand       Medication Adherence    Patient reported X missed doses in the last month: 0  Specialty Medication: Biktarvy 50-200-25mg   Patient is on additional specialty medications: No  Any gaps in refill history greater than 2 weeks in the last 3 months: no  Demonstrates understanding of importance of adherence: yes  Informant: spouse  Reliability of informant: reliable  Provider-estimated medication adherence level: good  Patient is at risk for Non-Adherence: No                            Specialty medication(s) dose(s) confirmed: Regimen is correct and unchanged.     Are there any concerns with adherence? No    Adherence counseling provided? Not needed    CLINICAL MANAGEMENT AND INTERVENTION      Clinical Benefit Assessment:    Do you feel the medicine is effective or helping your condition? Yes    HIV ASSOCIATED LABS:     Lab Results   Component Value Date/Time    HIVRS Not Detected 09/16/2021 11:11 AM    HIVRS Detected (A) 11/24/2020 12:22 PM    HIVRS Detected (A) 10/24/2020 11:17 AM    HIVCP 709 (H) 11/24/2020 12:22 PM    HIVCP 098,119 (H) 10/24/2020 11:17 AM    ACD4 756 09/16/2021 11:11 AM    ACD4 525 11/24/2020 12:22 PM    ACD4 480 (L) 10/30/2020 02:06 PM       Clinical Benefit counseling provided? Labs from 09/16/21 show evidence  of clinical benefit    Adverse Effects Assessment:    Are you experiencing any side effects? No    Are you experiencing difficulty administering your medicine? No    Quality of Life Assessment:    How many days over the past month did your HIV  keep you from your normal activities? For example, brushing your teeth or getting up in the morning. 0    Have you discussed this with your provider? Not needed    Acute Infection Status:    Acute infections noted within Epic:  No active infections  Patient reported infection: None    Therapy Appropriateness:    Is therapy appropriate and patient progressing towards therapeutic goals? Yes, therapy is appropriate and should be continued    DISEASE/MEDICATION-SPECIFIC INFORMATION      N/A    PATIENT SPECIFIC NEEDS     Does the patient have any physical, cognitive, or cultural barriers? No    Is the patient high risk? No    Does the patient require a Care Management Plan? No       SHIPPING     Specialty Medication(s) to be Shipped:   Infectious Disease: Biktarvy    Other medication(s) to be shipped: No additional medications requested for fill at this time     Changes to insurance: No    Delivery Scheduled: Yes, Expected medication delivery date: 02/15/22.     Medication will be delivered via Same Day Courier to the confirmed prescription address in [facility].    The patient will receive a drug information handout for each medication shipped and additional FDA Medication Guides as required.  Verified that patient has previously received a Conservation officer, historic buildings and a Surveyor, mining.    The patient or caregiver noted above participated in the development of this care plan and knows that they can request review of or adjustments to the care plan at any time.      All of the patient's questions and concerns have been addressed.    Roderic Palau   [facility] Specialty Pharmacist

## 2022-02-08 DIAGNOSIS — Z01818 Encounter for other preprocedural examination: Principal | ICD-10-CM

## 2022-02-10 NOTE — Unmapped (Signed)
-----   Message from Minus Liberty, [place] sent at 02/08/2022  7:58 AM EDT -----  Please have patient complete labs and CXR for preoperative clearance for left knee procedure.

## 2022-02-10 NOTE — Unmapped (Signed)
I have called and left message for pt to return my call on [phone].  There was no answer and no VM on [phone].

## 2022-02-11 NOTE — Unmapped (Signed)
I have received a call back from pt. She reports [place] got [place] country today. I have advised pt of the instructions per Lise Auer, NPC. She voiced understanding and will try to take care of later today.

## 2022-02-11 NOTE — Unmapped (Signed)
I have called and left message for pt to return my call on [phone].  [phone]  is not in service

## 2022-02-15 ENCOUNTER — Ambulatory Visit: Admit: 2022-02-15 | Discharge: 2022-02-16 | Payer: PRIVATE HEALTH INSURANCE | Attending: Family | Primary: Family

## 2022-02-15 ENCOUNTER — Ambulatory Visit: Admit: 2022-02-15 | Discharge: 2022-02-16 | Payer: PRIVATE HEALTH INSURANCE

## 2022-02-15 DIAGNOSIS — G8929 Other chronic pain: Principal | ICD-10-CM

## 2022-02-15 DIAGNOSIS — M25562 Pain in left knee: Principal | ICD-10-CM

## 2022-02-15 DIAGNOSIS — Z79899 Other long term (current) drug therapy: Principal | ICD-10-CM

## 2022-02-15 DIAGNOSIS — S83281S Other tear of lateral meniscus, current injury, right knee, sequela: Principal | ICD-10-CM

## 2022-02-15 DIAGNOSIS — Z717 Human immunodeficiency virus [HIV] counseling: Principal | ICD-10-CM

## 2022-02-15 DIAGNOSIS — R7303 Prediabetes: Principal | ICD-10-CM

## 2022-02-15 DIAGNOSIS — B20 Human immunodeficiency virus [HIV] disease: Principal | ICD-10-CM

## 2022-02-15 DIAGNOSIS — Z01818 Encounter for other preprocedural examination: Principal | ICD-10-CM

## 2022-02-15 LAB — URINALYSIS WITH MICROSCOPY
BILIRUBIN UA: NEGATIVE
GLUCOSE UA: NEGATIVE
HYPHAL YEAST: NONE SEEN /HPF
KETONES UA: NEGATIVE
LEUKOCYTE ESTERASE UA: NEGATIVE
NITRITE UA: NEGATIVE
PH UA: 5 (ref 5.0–8.0)
PROTEIN UA: NEGATIVE
RBC UA: 6 /HPF — ABNORMAL HIGH (ref 0–3)
SPECIFIC GRAVITY UA: 1.028 (ref 1.005–1.030)
SQUAMOUS EPITHELIAL: 1 /HPF (ref 0–5)
UROBILINOGEN UA: 0.2
WBC CLUMPS: NONE SEEN /HPF
WBC UA: 1 /HPF (ref 0–2)
YEAST: NONE SEEN /HPF

## 2022-02-15 LAB — COMPREHENSIVE METABOLIC PANEL
ALBUMIN: 3.9 g/dL (ref 3.4–5.0)
ALKALINE PHOSPHATASE: 52 U/L (ref 46–116)
ALT (SGPT): 13 U/L (ref 10–49)
ANION GAP: 6 mmol/L (ref 3–11)
AST (SGOT): 17 U/L (ref <34)
BILIRUBIN TOTAL: 0.4 mg/dL (ref 0.3–1.2)
BLOOD UREA NITROGEN: 9 mg/dL (ref 9–23)
BUN / CREAT RATIO: 11
CALCIUM: 9.5 mg/dL (ref 8.7–10.4)
CHLORIDE: 105 mmol/L (ref 98–107)
CO2: 27 mmol/L (ref 20.0–31.0)
CREATININE: 0.8 mg/dL (ref 0.55–1.02)
EGFR CKD-EPI (2021) FEMALE: >90 mL/min/{1.73_m2} (ref >=60)
GLUCOSE RANDOM: 93 mg/dL (ref 70–99)
POTASSIUM: 4 mmol/L (ref 3.5–5.1)
PROTEIN TOTAL: 7.1 g/dL (ref 5.7–8.2)
SODIUM: 138 mmol/L (ref 136–145)

## 2022-02-15 LAB — CBC W/ AUTO DIFF
BASOPHILS ABSOLUTE COUNT: 0 10*9/L (ref 0.0–0.1)
BASOPHILS RELATIVE PERCENT: 0.7 %
EOSINOPHILS ABSOLUTE COUNT: 0.1 10*9/L (ref 0.0–0.5)
EOSINOPHILS RELATIVE PERCENT: 2.4 %
HEMATOCRIT: 35.9 % (ref 34.0–44.0)
HEMOGLOBIN: 11.8 g/dL (ref 11.3–14.9)
LYMPHOCYTES ABSOLUTE COUNT: 1.3 10*9/L (ref 1.1–3.6)
LYMPHOCYTES RELATIVE PERCENT: 24 %
MEAN CORPUSCULAR HEMOGLOBIN CONC: 32.9 g/dL (ref 32.0–36.0)
MEAN CORPUSCULAR HEMOGLOBIN: 26 pg (ref 25.9–32.4)
MEAN CORPUSCULAR VOLUME: 79.1 fL (ref 77.6–95.7)
MEAN PLATELET VOLUME: 7.5 fL (ref 6.8–10.7)
MONOCYTES ABSOLUTE COUNT: 0.6 10*9/L (ref 0.3–0.8)
MONOCYTES RELATIVE PERCENT: 10.8 %
NEUTROPHILS ABSOLUTE COUNT: 3.5 10*9/L (ref 1.8–7.8)
NEUTROPHILS RELATIVE PERCENT: 62.1 %
PLATELET COUNT: 277 10*9/L (ref 150–450)
RED BLOOD CELL COUNT: 4.53 10*12/L (ref 3.95–5.13)
RED CELL DISTRIBUTION WIDTH: 15.8 % — ABNORMAL HIGH (ref 12.2–15.2)
WBC ADJUSTED: 5.6 10*9/L (ref 3.6–11.2)

## 2022-02-15 LAB — PROTIME-INR
INR: 1.1
PROTIME: 12.6 s (ref 9.8–12.8)

## 2022-02-15 LAB — HEMOGLOBIN A1C
ESTIMATED AVERAGE GLUCOSE: 111 mg/dL
HEMOGLOBIN A1C: 5.5 % (ref 4.8–5.6)

## 2022-02-15 LAB — APTT: APTT: 37.6 s — ABNORMAL HIGH (ref 25.1–36.5)

## 2022-02-15 MED ORDER — WEGOVY 1.7 MG/0.75 ML SUBCUTANEOUS PEN INJECTOR
SUBCUTANEOUS | 0 refills | 28 days | Status: CP
Start: 2022-02-15 — End: 2022-03-15

## 2022-02-15 MED ORDER — TRAMADOL 50 MG TABLET
ORAL_TABLET | Freq: Four times a day (QID) | ORAL | 0 refills | 8 days | Status: CP
Start: 2022-02-15 — End: ?

## 2022-02-15 MED ORDER — METFORMIN ER 750 MG TABLET,EXTENDED RELEASE 24 HR
ORAL_TABLET | Freq: Every day | ORAL | 0 refills | 90 days | Status: CP
Start: 2022-02-15 — End: 2022-05-16

## 2022-02-15 MED FILL — BIKTARVY 50 MG-200 MG-25 MG TABLET: ORAL | 30 days supply | Qty: 30 | Fill #3

## 2022-02-15 NOTE — Unmapped (Signed)
Assessment/Plan:      Diagnosis ICD-10-CM Associated Orders   1. HIV disease (CMS-HCC)  B20 HIV RNA, Quantitative, PCR     Lymphocyte Markers Limited      2. Prediabetes  R73.03 metFORMIN (GLUCOPHAGE-XR) 750 MG 24 hr tablet      3. Chronic pain of left knee  M25.562     G89.29       4. Acute lateral meniscus tear of right knee, sequela  S83.281S       5. Encounter for HIV counseling  Z71.7 HIV RNA, Quantitative, PCR     Lymphocyte Markers Limited      6. On highly active antiretroviral therapy (HAART)  Z79.899 HIV RNA, Quantitative, PCR     Lymphocyte Markers Limited          Return in about 6 months (around 08/18/2022).    HPI:     Shelley Beck is a 46 y.o. year old Black/African American female who presents today for follow-up. Referred by [facility] for new HIV diagnosis. Patient is a beautiful, intelligent travel nurse. Here for HIV follow up.  Has expereienced cervical lymphadenopathy, swollen tonsils, and fatigue for the last few months, has resolved since starting HAART Biktarvy.  Denies fever, chills, NVD, night sweats.  Went in for routine labs and HIV1 antibody returned positive.  Patient is treatment naive.  Her boyfriend, Shelley Beck, has been tested and is HIV positive also, on Biktarvy. CD4% 42 Absolute CD4 756 HIV RNA not detected. Hep ABC negative, RPR non-reactive. CMV positive.  Discussed HIV biology, medication resistance, medication compliance, safe sex with condoms, U=U.  Referred to [facility] program for counseling. Patient has been taking HAART Biktarvy with no missed doses or bothersome side effects.  Potential medication side effects discussed. HIV labs today.     Patient is prediabetic, on Metformn 750 mg daily, refill sent today. Has followed healthy diet and exercise routines for many years without much weight loss. Eats same amount today with less energy expended so he has gained weight.  Has not tried any weight loss medications previously.  BMI 39.50 223 pounds. Patient has tried dietary and lifestyle modifications including calorie restrictions, weight loss behavior modifications, and increased physical activity for more than six months and patient has been unable to lose 5% of body weight.  It is medically necessary for patient to add pharmacotherapy to their obesity treatment plan since they have been unable to achieve clinically significant results with active enrollment and participation in lifestyle changes. Patient will continue lifestyle modifications in addition to starting pharmacotherapy to treat their obesity, including reduced calorie diet as specified below, increased physical activity as detailed below and behavioral changes.  Patient's weight prior to starting medication to treat their obesity was 223 pounds, pharmacotherapy is recommended following AACE guidelines.      Patient has administering Mounjaro 12.5 mg injectable every 7 days for 28 days. She has lost 34 pounds.  Side effects includes dry mouth, some nausea and no appetite.  She has increased her daily water intake.  Discussed getting adequate amout of daily protein around 130-150g (0.7g per pound body weight) daily to ensure minimal muscle loss.  Zofran 8 mg SL sent to pharmacy for nausea prn at last appointment.       Diet  Eating 6 meals per day instead of 3 meals  Limiting intake to 1200-1600 calories per day  Intermittent fasting has been implemented     Dietary goal: Recommended low carbohydrates, smaller  portions, and decrease fast food to reduce calorie intake.  Maintain 1200-1600 calories per day.  Goal is 1-2 pounds weight loss per week.  Discussed weight loss plateau.  She will increase daily activity with walking per guidelines below.  Utilizing air fryer at home     Physical Activity (Physical exercise program appropriate for age, physical condition, including expecatation for compliance, and recommendations discussed).  -Discussed need for at least 150 minutes of moderate aerobic activity weekly (including swimming, walking, jogging, biking, etc.  -Recommended exercise goal:  Recommend daily walking with increasing time/duration.  Goal is 60 minutes/day, as tolerated.  Recommend 5-7 days/week.  60-90 minutes of moderate intensity physical activity most days out of the week (5-7 days preferred).       Behavioral Intervention (Specific strategies and tools for overcoming barriers and improving dietary compliance discussed).  -Rec. Behavioral goals:  Self-monitoring food intake (keep written or digital meal diary that tracks all meals, snacks and beverages), stress management, logbook of food intake and physical activity, social support.  Food chart with high protein foots listed provided to patient      Pharmacotherapy: Will switch to [facility] 1.7 mg/0.75 ml Pnlj subcutaneous every days for 28 days. Mounjaro cost has increased significantly. Will titrate dose according to patient tolerability.  Medication side effects discussed.  Injection demonstration completed.  Patient able to self-inject appropriately.     Will check baseline labs today to assess glucose, HgA1C, B12, Vitamin D, FSH, Estradiol, Cortisol, CBC, CMP/diff, Sed. CRP, Lipid panel, TSH, T3, T4.     Has complaints of continued chronic right knee pain from osteoarthritis.  She will continue wearing knee brace, RICE therapy, advil/tylenol combination and meloxicam prn. Has right meniscus tear repair procedure 03/02/22. Follow up in 6 months.        Past Medical/Surgical History:     Past Medical History:   Diagnosis Date    Anxiety     managed on lexapro    HIV disease (CMS-HCC)     Neck pain      Past Surgical History:   Procedure Laterality Date    CHOLECYSTECTOMY      nsvd      X3    scoliosis repair      uterine ablation         Social History:     Social History     Socioeconomic History    Marital status: Divorced     Spouse name: None    Number of children: None    Years of education: None    Highest education level: None Tobacco Use    Smoking status: Never    Smokeless tobacco: Never    Tobacco comments:     Vape occasionally    Vaping Use    Vaping Use: Every day    Substances: Nicotine, Flavoring   Substance and Sexual Activity    Alcohol use: Yes     Alcohol/week: 1.0 standard drink of alcohol     Types: 1 Glasses of wine per week     Comment: socially    Drug use: Never    Sexual activity: Yes     Partners: Male       Family History:     Family History   Problem Relation Age of Onset    No Known Problems Mother     No Known Problems Father        Allergies:     Patient has no known allergies.  Current Medications:     Current Outpatient Medications   Medication Sig Dispense Refill    albuterol HFA 90 mcg/actuation inhaler Inhale 2 puffs every four (4) hours as needed for wheezing or shortness of breath. 8 g 0    ascorbic acid, vitamin C, (VITAMIN C) 1000 MG tablet Take 2 tablets (2,000 mg total) by mouth daily.      bictegrav-emtricit-tenofov ala (BIKTARVY) 50-200-25 mg tablet Take 1 tablet by mouth daily. 30 tablet 11    calcium carbonate (OS-CAL) 1,500 mg (600 mg elem calcium) tablet Take 1 tablet (600 mg of elem calcium total) by mouth daily.      escitalopram oxalate (LEXAPRO) 20 MG tablet Take 1 tablet (20 mg total) by mouth daily. 90 tablet 1    hydrOXYzine (ATARAX) 25 MG tablet TAKE 1 TO 2 TABLETS BY MOUTH THREE TIMES DAILY AS NEEDED FOR ANXIETY      ibuprofen (MOTRIN) 800 MG tablet TAKE 1 TABLET BY MOUTH THREE TIMES DAILY FOR 10 DAYS      ondansetron (ZOFRAN-ODT) 4 MG disintegrating tablet DISSOLVE ONE TABLET ON TONGUE EVERY 8 HRS AS NEEDED FOR NAUSEA      QUEtiapine (SEROQUEL) 25 MG tablet nightly as needed.      metFORMIN (GLUCOPHAGE-XR) 750 MG 24 hr tablet Take 1 tablet (750 mg total) by mouth daily before breakfast. 90 tablet 0    tiZANidine (ZANAFLEX) 4 MG tablet TAKE 1 TABLET BY MOUTH EVERY 6 HOURS FOR 10 DAYS (Patient not taking: Reported on 02/15/2022)      WEGOVY 1.7 MG/0.75 ML SUBCUTANEOUS PEN INJECTOR Inject 0.75 mL (1.7 mg total) under the skin every seven (7) days for 28 days. 3 mL 0     No current facility-administered medications for this visit.       ROS:   Review of Systems   Constitutional: Negative.    HENT: Negative.     Eyes: Negative.    Respiratory: Negative.     Cardiovascular: Negative.    Gastrointestinal: Negative.    Endocrine: Negative.    Genitourinary: Negative.    Musculoskeletal:  Positive for arthralgias, gait problem and joint swelling.   Allergic/Immunologic: Positive for immunocompromised state.   Hematological: Negative.    Psychiatric/Behavioral: Negative.          Vital Signs:     Vitals:    02/15/22 0955   BP: 132/79   Pulse: 83   Resp: 16   Temp: 36.3 ??C (97.3 ??F)   SpO2: 98%     BP Readings from Last 3 Encounters:   02/15/22 132/79   01/14/22 123/78   12/11/21 120/81     Wt Readings from Last 3 Encounters:   02/15/22 (!) 101.2 kg (223 lb)   01/14/22 (!) 104.8 kg (231 lb)   12/11/21 (!) 104.8 kg (231 lb)       Immunizations:     Immunization History   Administered Date(s) Administered    COVID-19 VACC,MRNA,(PFIZER)(PF) 07/24/2019, 08/14/2019, 04/02/2020    Influenza Virus Vaccine, unspecified formulation 03/13/2017    PPD Test 01/16/2017, 03/26/2020, 10/28/2020, 12/01/2020    TdaP 10/28/2020       Labs:     Platelet   Date Value Ref Range Status   10/27/2021 326 150 - 450 10*9/L Final     HGB   Date Value Ref Range Status   10/27/2021 12.9 11.3 - 14.9 g/dL Final     WBC   Date Value Ref Range Status   10/27/2021 4.7 3.6 -  11.2 10*9/L Final     BUN   Date Value Ref Range Status   10/27/2021 10 9 - 23 mg/dL Final     Creatinine   Date Value Ref Range Status   10/27/2021 0.93 (H) 0.50 - 0.80 mg/dL Final     ALT   Date Value Ref Range Status   10/27/2021 16 10 - 49 U/L Final     AST   Date Value Ref Range Status   10/27/2021 20 <34 U/L Final     Potassium   Date Value Ref Range Status   10/27/2021 3.9 3.5 - 5.1 mmol/L Final     CO2   Date Value Ref Range Status   10/27/2021 28.0 20.0 - 31.0 mmol/L Final     HIV RNA   Date Value Ref Range Status   11/24/2020 709 (H) <0 copies/mL Final     Hepatitis C Ab   Date Value Ref Range Status   10/24/2020 Nonreactive Nonreactive Final     Gonorrhoeae NAA   Date Value Ref Range Status   10/24/2020 Negative Negative Final     Chlamydia trachomatis, NAA   Date Value Ref Range Status   10/24/2020 Negative Negative Final     Urine Culture, Comprehensive   Date Value Ref Range Status   12/12/2018 50,000 to 100,000 CFU/mL Klebsiella pneumoniae (A)  Final   12/12/2018 <10,000 CFU/mL Normal Urogenital Flora  Final     CD4% (T Helper)   Date Value Ref Range Status   09/16/2021 42 34 - 58 % Final         Physical Exam:   Physical Exam  Constitutional:       Appearance: Normal appearance. She is obese.   HENT:      Head: Normocephalic and atraumatic.      Right Ear: External ear normal.      Left Ear: External ear normal.      Nose: Nose normal.      Mouth/Throat:      Mouth: Mucous membranes are dry.   Eyes:      Conjunctiva/sclera: Conjunctivae normal.      Pupils: Pupils are equal, round, and reactive to light.   Cardiovascular:      Rate and Rhythm: Normal rate and regular rhythm.      Pulses: Normal pulses.      Heart sounds: Normal heart sounds.   Pulmonary:      Effort: Pulmonary effort is normal.      Breath sounds: Normal breath sounds.   Abdominal:      General: Bowel sounds are normal.      Palpations: Abdomen is soft.   Musculoskeletal:         General: Swelling present.      Cervical back: Normal range of motion and neck supple.   Skin:     General: Skin is warm and dry.      Capillary Refill: Capillary refill takes less than 2 seconds.   Neurological:      General: No focal deficit present.      Mental Status: She is alert and oriented to person, place, and time.   Psychiatric:         Mood and Affect: Mood normal.         Behavior: Behavior normal.         Thought Content: Thought content normal.         Judgment: Judgment normal.

## 2022-02-15 NOTE — Unmapped (Signed)
The patient called the office stating she is experiencing excruciating pain in her left knee. She rates it at 10/10. 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 She has been taking 4000 mg Tylenol and 2400 mg Ibuprofen daily due to pain. She has also been alternating ice/heat for pain. She is requesting medication such as a muscle relaxer or Tramadol for pain control. She is scheduled for left knee arthroscopy partial medial menisectomy on September 12. Please advise.

## 2022-02-16 NOTE — Unmapped (Signed)
PA submitted via CoverMyMeds    Shelley Beck (Shelley Beck) - [id number]  [facility] 1.7MG /0.75ML auto-injectors  Status: Sent To Plan-Created: August 28th, 2023 [id number]: August 29th, 2023

## 2022-02-18 NOTE — Unmapped (Signed)
Awaiting response

## 2022-02-23 NOTE — Unmapped (Signed)
Pt instructed on use of [facility] night before surgery. Instructed  NPO after 10pm night before surgery and meds per instructions and driver needed .Pt also informed that we will call the day before surgery to give arrival time for day of surgery/procedure. Pt/family allowed to ask questions.

## 2022-03-02 ENCOUNTER — Ambulatory Visit: Admit: 2022-03-02 | Discharge: 2022-03-02 | Payer: PRIVATE HEALTH INSURANCE

## 2022-03-02 ENCOUNTER — Encounter: Admit: 2022-03-02 | Discharge: 2022-03-02 | Payer: PRIVATE HEALTH INSURANCE

## 2022-03-02 LAB — PREGNANCY, URINE: PREGNANCY TEST URINE: NEGATIVE

## 2022-03-02 MED ORDER — PROMETHAZINE 12.5 MG TABLET
ORAL_TABLET | Freq: Four times a day (QID) | ORAL | 0 refills | 3 days | Status: CP | PRN
Start: 2022-03-02 — End: 2022-03-09

## 2022-03-02 MED ORDER — ONDANSETRON 4 MG DISINTEGRATING TABLET
ORAL_TABLET | Freq: Three times a day (TID) | ORAL | 0 refills | 4 days | Status: CN | PRN
Start: 2022-03-02 — End: 2022-03-09

## 2022-03-02 MED ORDER — OXYCODONE-ACETAMINOPHEN 5 MG-325 MG TABLET
ORAL_TABLET | ORAL | 0 refills | 5 days | Status: CP | PRN
Start: 2022-03-02 — End: 2022-03-07

## 2022-03-02 MED ORDER — DOCUSATE SODIUM 100 MG CAPSULE
ORAL_CAPSULE | Freq: Every day | ORAL | 0 refills | 30 days | Status: CP
Start: 2022-03-02 — End: 2022-04-01

## 2022-03-02 MED ADMIN — phenylephrine (NEO-SYNEPHRINE) injection: INTRAVENOUS | @ 14:00:00 | Stop: 2022-03-02

## 2022-03-02 MED ADMIN — fentaNYL (PF) (SUBLIMAZE) injection: INTRAVENOUS | @ 14:00:00 | Stop: 2022-03-02

## 2022-03-02 MED ADMIN — midazolam (VERSED) injection: INTRAVENOUS | @ 13:00:00 | Stop: 2022-03-02

## 2022-03-02 MED ADMIN — lactated Ringers infusion: 10 mL/h | INTRAVENOUS | @ 13:00:00 | Stop: 2022-03-02

## 2022-03-02 MED ADMIN — propofoL (DIPRIVAN) injection: INTRAVENOUS | @ 13:00:00 | Stop: 2022-03-02

## 2022-03-02 MED ADMIN — BUPivacaine (PF) (MARCAINE) 0.25 % (2.5 mg/mL) 12.5 mL, lidocaine-EPINEPHrine (XYLOCAINE W/EPI) 1 %-1:100,000 12.5 mL 25 mL: @ 14:00:00 | Stop: 2022-03-02

## 2022-03-02 MED ADMIN — ondansetron (ZOFRAN) injection: INTRAVENOUS | @ 14:00:00 | Stop: 2022-03-02

## 2022-03-02 MED ADMIN — glycopyrrolate (ROBINUL) injection: INTRAVENOUS | @ 14:00:00 | Stop: 2022-03-02

## 2022-03-02 MED ADMIN — acetaminophen (TYLENOL) tablet 975 mg: 975 mg | ORAL | @ 12:00:00 | Stop: 2022-03-02

## 2022-03-02 MED ADMIN — lidocaine (XYLOCAINE) 20 mg/mL (2 %) injection: INTRAVENOUS | @ 13:00:00 | Stop: 2022-03-02

## 2022-03-02 MED ADMIN — ceFAZolin (ANCEF) IVPB 2 g in 50 ml dextrose (premix): 2 g | INTRAVENOUS | @ 14:00:00 | Stop: 2022-03-02

## 2022-03-02 MED ADMIN — oxyCODONE (ROXICODONE) immediate release tablet 5 mg: 5 mg | ORAL | @ 15:00:00 | Stop: 2022-03-02

## 2022-03-02 MED ADMIN — dexamethasone (DECADRON) 4 mg/mL injection: INTRAVENOUS | @ 14:00:00 | Stop: 2022-03-02

## 2022-03-02 MED ADMIN — dexmedeTOMIDine 400 mcg in sodium chloride 0.9 % 100 mL (4 mcg/mL) infusion: INTRAVENOUS | @ 14:00:00 | Stop: 2022-03-02

## 2022-03-02 MED ADMIN — ketorolac (TORADOL) injection 30 mg: 30 mg | INTRAVENOUS | @ 15:00:00 | Stop: 2022-03-02

## 2022-03-02 NOTE — Unmapped (Signed)
HPI: Patient is 46 year old female here for left knee pain with sudden onset. She denies any injury to the left knee. Patient points to the back of her knee as the primary location of the pain.  Pain is constant, 5/10, described as aching and sharp. Activities such as standing, walking and stairs makes the pain worse. She describes a feeling of clicking in the left knee. Patient takes Ibuprofen as needed for the pain. Patient works as an Systems analyst. She was previously seen by [facility] about a month ago for the left knee and received a steroid injection, hinged knee brace, oral steroids and a muscle relaxer.     Here for MRI follow up,  continued pain.     Past Medical History:   Diagnosis Date    Anxiety     managed on lexapro    HIV disease (CMS-HCC)     Neck pain        Past Surgical History:   Procedure Laterality Date    CHOLECYSTECTOMY      scoliosis repair      uterine ablation         Allergies   Allergen Reactions    Tramadol Other (See Comments)     Severe headache       Family History   Problem Relation Age of Onset    No Known Problems Mother     No Known Problems Father        Social History     Occupational History    Not on file   Tobacco Use    Smoking status: Never    Smokeless tobacco: Never    Tobacco comments:     Vape occasionally    Vaping Use    Vaping Use: Every day    Substances: Nicotine, Flavoring   Substance and Sexual Activity    Alcohol use: Yes     Alcohol/week: 1.0 standard drink of alcohol     Types: 1 Glasses of wine per week     Comment: socially    Drug use: Never    Sexual activity: Yes     Partners: Male       ROS: otherwise negative     I have reviewed past medical, surgical, social and family history, medications and allergies as documented in the EMR.      Review of Systems:  A comprehensive 12+ review of systems was negative unless otherwise stated in the HPI.      Physical Exam:                                     Body mass index is 40.92 kg/m??.      Constitutional: In no acute distress  Respiratory: No labored breathing and no audible wheezing      Left Knee Exam:   Ambulates with non antalgic gait  Back is non tender  Hip with pain free ROM  Skin intact  No swelling, ecchymosis, or erythema  Tender to palpation medial joint line  ROM: 0-125  5/5 Hamsting and quad strength  No pain with calf squeeze  Stable to anterior, posterior, varus and valgus stress  Lower extremity motor and neuro exam:  gastroc-soleus complex/tibialis anterior/ and extensor hallucis longus motor intact; sensation intact to light touch saphenous/sural/ superficial peroneal/ deep peroneal/ and tibial nerve distribution, warm and well perfused     DP palpable  Imaging:      MRI [facility] from 12/18/21  1.    Degenerated free edge oblique tear medial meniscus posterior horn with candidate for superior peripheral flap along the posterior root as described. Additional small chronic horizontal tear of the body. No extrusion.  2.    Medial and patellofemoral predominant osteoarthrosis with focal high-grade chondral defects as described. High-grade chondral fissuring of the patella with equivocal nondisplaced chondral flap formation.  3.    Mild subchondral edema of the medial femoral condyle, which may be degenerative or mild osseous contusion.  4.    Grade 1 sprain of medial collateral ligament.  5.    Early mucoid degeneration of anterior cruciate ligament.  6.    Small joint effusion and synovitis.  7.    Mild scattered tendinopathy as above.           A/P: Patient is a 46 year old female with left knee pain, likely DJD and medial meniscus tear. Has not responded to NSAIDs and steroid injection.  Risks, benefits and alternatives discussed.  She is indicated for a  left knee arthroscopic partial medial meniscectomy

## 2022-03-02 NOTE — Unmapped (Signed)
Operative Note  (CSN: [id number])      Date of Surgery: 03/02/2022    Pre-op Diagnosis: LEFT KNEE MEDIAL MENISCUS TEAR    Post-op Diagnosis: same       [Note: Revisions to procedures should be made in chart - see Procedures tab.]    Left - KNEE ARTHROSCOPY PARTIAL MEDIAL MENISECTOMY    Performing Service: Orthopedics  Surgeon(s) and Role:     * Mcneil Sober, MD - Primary    Assistant: Physician Assistant: Rosina Lowenstein, PA-due to no otherwise qualified assistant this PA assisted in his case for optimal patient results    Anesthesia: General with 25 MLS of half, quarter percent Marcaine plain, and half, 1% lidocaine with epi to portal sites knee by surgeon    Estimated Blood Loss: 5 mL    Complications: none    Specimens: None collected    Indications: 46 year old female with MRI revealing medial meniscus tear.  Risk benefits and alternatives were discussed, consent was obtained, patient like to proceed with case.    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  She was prepped and draped usual sterile fashion supine with lateral post.  Perioperative antibiotics were administered.  Standard lateral portals made 11 blade, blade facing superiorly at the border the lateral border the patella inferior pole the patella.  Blunt trocar was used to introduce arthroscope into the knee.  Position used superior to medial meniscus under direct visualization to create a medial portal.  Standard diagnostic arthroscopy commenced with findings noted above.  The posterior horn medial meniscus was addressed with combination of shavers and biters to a stable rim.  Was reprobed following partial meniscectomy noted to be stable.  There was cartilage flap in the medial femoral condyle which was unstable was debrided with a shaver on reverse.  There was some fraying of the cartilage on the patella was debrided with the shaver on reverse.  Cartilage was reprobed noted to be stable.  Arthroscopic instruments removed from the knee.  Portal sites closed with 3 nylon.  Sterile dressings were placed.    Postoperative plan patient be discharged home with pain medication.  She will be weightbearing as tolerated.  She instructed up her dressings on dry.  She instructed follow-up in my office in 1 week in 2 weeks for wound checks.    Surgeon Notes: I was present and scrubbed for the entire procedure    Annitta Jersey   Date: 03/02/2022  Time: 9:59 AM

## 2022-03-10 ENCOUNTER — Ambulatory Visit: Admit: 2022-03-10 | Discharge: 2022-03-11 | Payer: PRIVATE HEALTH INSURANCE

## 2022-03-10 NOTE — Unmapped (Signed)
Patient called to let [name] NP know that her insurance denied Shelley Beck-  wants to know if a PA could be done or what can be done to get this medication approved/   Please advise?

## 2022-03-11 NOTE — Unmapped (Signed)
HPI:  Patient is 1 weeks s/p left knee arthroscopic partial medial menisectomy, date of surgery 03/02/2022 presents today for a follow up. Pain well controlled with percocet, the need for which has been decreasing since surgery. Overall doing well. Has been weightbearing as tolerated ambulating without assistance devices and is able to walk short distances without much discomfort. Swelling has been decreasing, no instability noted. Denies incisional drainage. Denies N/T, fevers, chills, night sweats, calf pain, chest pain, shortness of breath. Denies any other questions or concerns.     I have reviewed past medical, surgical, social and family history, medications and allergies as documented in the EMR.    Review of Systems:   A comprehensive review of systems is documented elsewhere in the encounter. See HPI.      Physical Exam:  Constitutional: No acute distress  Pulmonary: Non-labored breathing, no audible wheezing  Left Lower Extremity  No swelling, ecchymosis, or erythema noted  Surgical incisions well approximated, clean, dry and intact.  Knee ROM: 0-120  Left lower extremity motor and neuro exam:  gastroc-soleus complex/tibialis anterior/ and extensor hallucis longus motor intact; sensation intact to light touch saphenous/sural/ superficial peroneal/ deep peroneal/ and tibial nerve distribution, warm and well perfused  No pain with compression of the calf.  DP pulse palpable      Vitals:    03/10/22 1000   BP: 111/76   Pulse: 101       A/P:  Doing well 1 week status post left knee arthroscopic partial medial meniscectomy. Continue pain control with percocet. Keep incision clean and dry, covered. Continue weightbearing as tolerated and active progressive range of motion over the next several weeks. Modify activity, no aggressive exercise yet. Continue to rest, ice and elevate. Follow up with Dr. Roselie Awkward in 1 week.     Patient understands and agrees with the plan today and has no further questions or concerns. Patient was encouraged to call our office with any further questions.

## 2022-03-17 ENCOUNTER — Ambulatory Visit: Admit: 2022-03-17 | Discharge: 2022-03-18 | Payer: PRIVATE HEALTH INSURANCE

## 2022-03-17 NOTE — Unmapped (Signed)
Shelley Beck called and stated her pharmacy told her they still haven't rec'd the prior auth for her Reginal Lutes from our office. Shelley Beck put a note in Epic that it was denied and an Appeal will be pursued via CoverMyMeds. I spoke with Shelley Beck. She is currently working on that. Pt stated understanding.

## 2022-03-17 NOTE — Unmapped (Signed)
Shelley Beck ([name] - [id number]  AVWUJW 1.7MG /0.75ML auto-injectors  Outcome: Denied-Created: August 28th, 2023 [id number]: August 29th, 2023    Per provider instruction. Appeal will be pursued via CoverMyMeds.

## 2022-03-18 NOTE — Unmapped (Signed)
APPEAL  ZEPLYN VROMAN (Key: [id number])  VWUJWJ 1.7MG /0.75ML auto-injectors  Outcome: Unknown-Created: September 5th, 2023-Sent: September 27th, 2023

## 2022-03-19 NOTE — Unmapped (Signed)
[facility] Refill Coordination Note    Specialty Medication(s) to be Shipped:   Infectious Disease: Biktarvy    Other medication(s) to be shipped: No additional medications requested for fill at this time     Shelley Beck, DOB: 1975/09/16  Phone: [phone] (home)       All above HIPAA information was verified with patient.     Was a Nurse, learning disability used for this call? No    Completed refill call assessment today to schedule patient's medication shipment from the [facility] [phone]).  All relevant notes have been reviewed.     Specialty medication(s) and dose(s) confirmed: Regimen is correct and unchanged.   Changes to medications: Viona reports no changes at this time.  Changes to insurance: No  New side effects reported not previously addressed with a pharmacist or physician: None reported  Questions for the pharmacist: No    Confirmed patient received a Conservation officer, historic buildings and a Surveyor, mining with first shipment. The patient will receive a drug information handout for each medication shipped and additional FDA Medication Guides as required.       DISEASE/MEDICATION-SPECIFIC INFORMATION        N/A    SPECIALTY MEDICATION ADHERENCE     Medication Adherence    Patient reported X missed doses in the last month: 0  Specialty Medication: biktarvy 50-200-25mg   Patient is on additional specialty medications: No                                Were doses missed due to medication being on hold? No    Biktarvy 50-200-25 mg: 7 days of medicine on hand        REFERRAL TO PHARMACIST     Referral to the pharmacist: Not needed      [facility]     Shipping address confirmed in Epic.     Delivery Scheduled: Yes, Expected medication delivery date: 03/24/22.     Medication will be delivered via Next Day Courier to the prescription address in Epic WAM.    [name]   [facility] Specialty Technician

## 2022-03-21 NOTE — Unmapped (Signed)
HPI:  Patient is 2 weeks s/p left knee arthroscopic partial medial menisectomy, date of surgery 03/02/2022 presents today for a follow up. Pain well controlled with percocet, the need for which has been decreasing since surgery. Overall doing well. Has been weightbearing as tolerated ambulating without assistance devices and is able to walk short distances without much discomfort. Swelling has been decreasing, no instability noted. Denies incisional drainage. Denies N/T, fevers, chills, night sweats, calf pain, chest pain, shortness of breath. Denies any other questions or concerns.     I have reviewed past medical, surgical, social and family history, medications and allergies as documented in the EMR.    Review of Systems:   A comprehensive review of systems is documented elsewhere in the encounter. See HPI.      Physical Exam:  Constitutional: No acute distress  Pulmonary: Non-labored breathing, no audible wheezing  Left Lower Extremity  No swelling, ecchymosis, or erythema noted  Surgical incisions well approximated, clean, dry and intact.  Sutures removed  Knee ROM: 0-120  Left lower extremity motor and neuro exam:  gastroc-soleus complex/tibialis anterior/ and extensor hallucis longus motor intact; sensation intact to light touch saphenous/sural/ superficial peroneal/ deep peroneal/ and tibial nerve distribution, warm and well perfused  No pain with compression of the calf.  DP pulse palpable      Vitals:    03/17/22 1505   BP: 127/85   Pulse: 84       A/P:  Doing well 2 weeks status post left knee arthroscopic partial medial meniscectomy. Continue pain control with percocet. Keep incision clean and dry, covered. Continue weightbearing as tolerated and active progressive range of motion over the next several weeks. Modify activity, no aggressive exercise yet. Continue to rest, ice and elevate. The patient is progressing well. Sutures removed in clinic. The patient may shower now and allow soapy water to run down their incision. No soaking, lotions, ointments, or creams. Follow up in 4 weeks for reassessment. Patient understands and agrees with the plan today and has no further questions or concerns. Patient was encouraged to call our office with any further questions.

## 2022-03-22 NOTE — Unmapped (Signed)
Still awaiting APPEAL response.

## 2022-03-23 MED FILL — BIKTARVY 50 MG-200 MG-25 MG TABLET: ORAL | 30 days supply | Qty: 30 | Fill #4

## 2022-04-06 NOTE — Unmapped (Signed)
APPEAL  Shelley Beck (Key: [id number])  VHQION 1.7MG /0.75ML auto-injectors  Outcome: Denied-Created: September 5th, 2023-Sent: September 27th, 2023

## 2022-04-15 ENCOUNTER — Ambulatory Visit: Admit: 2022-04-15 | Discharge: 2022-04-16 | Payer: PRIVATE HEALTH INSURANCE

## 2022-04-15 DIAGNOSIS — G8929 Other chronic pain: Principal | ICD-10-CM

## 2022-04-15 DIAGNOSIS — M25562 Pain in left knee: Principal | ICD-10-CM

## 2022-04-15 NOTE — Unmapped (Signed)
Regarding work : Patient is able to return to work, performing 3 eight hour shifts this week. Able to advance to 3 twelve hour shifts next week.

## 2022-04-15 NOTE — Unmapped (Signed)
HPI: Patient is 6 weeks s/p left knee arthroscopic partial medial menisectomy, date of surgery 03/02/2022 presents today for a follow up. Overall doing well. Has been weightbearing as tolerated ambulating without assistance devices and is able to walk short distances without much discomfort. Swelling has been decreasing, no instability noted. Denies incisional drainage. Denies N/T, fevers, chills, night sweats, calf pain, chest pain, shortness of breath. Denies any other questions or concerns.     I have reviewed past medical, surgical, social and family history, medications and allergies as documented in the EMR.      Review of Systems:  A comprehensive 12+ review of systems was negative unless otherwise stated in the HPI.     Physical Exam  There were no vitals filed for this visit.  Vitals:    04/15/22 1331   Weight: (!) 101.2 kg (223 lb)     Body mass index is 39.5 kg/m??.     Constitutional: In no acute distress  Respiratory: No labored breathing and no audible wheezing     Left Lower Extremity  No swelling, ecchymosis, or erythema noted  Surgical incisions well approximated, clean, dry and intact.  Knee ROM: 0-120  Left lower extremity motor and neuro exam:  gastroc-soleus complex/tibialis anterior/ and extensor hallucis longus motor intact; sensation intact to light touch saphenous/sural/ superficial peroneal/ deep peroneal/ and tibial nerve distribution, warm and well perfused  No pain with compression of the calf.  DP pulse palpable      A/P: Patient is 6 weeks s/p left knee arthroscopic partial medial menisectomy, date of surgery 03/02/2022. Continue weightbearing as tolerated and active progressive range of motion over the next several weeks. Modify activity, no aggressive exercise yet. Continue to rest, ice and elevate. The patient is progressing well. Follow up as needed    I attest that I, Sarah g Christell Constant, personally documented this note while acting as scribe for [employer], [place].      Sarah g Christell Constant, Scribe.  04/15/2022     The documentation recorded by the scribe accurately reflects the service I personally performed and the decisions made by me.    Rosina Lowenstein, PA

## 2022-04-29 NOTE — Unmapped (Signed)
[facility] Refill Coordination Note    Specialty Medication(s) to be Shipped:   Infectious Disease: Biktarvy    Other medication(s) to be shipped: No additional medications requested for fill at this time     Shelley Beck, DOB: 1975-06-30  Phone: [phone] (home)       All above HIPAA information was verified with patient's family member, finley.     Was a Nurse, learning disability used for this call? No    Completed refill call assessment today to schedule patient's medication shipment from the [facility] [phone]).  All relevant notes have been reviewed.     Specialty medication(s) and dose(s) confirmed: Regimen is correct and unchanged.   Changes to medications: [name] reports no changes at this time.  Changes to insurance: No  New side effects reported not previously addressed with a pharmacist or physician: None reported  Questions for the pharmacist: No    Confirmed patient received a Conservation officer, historic buildings and a Surveyor, mining with first shipment. The patient will receive a drug information handout for each medication shipped and additional FDA Medication Guides as required.       DISEASE/MEDICATION-SPECIFIC INFORMATION        N/A    SPECIALTY MEDICATION ADHERENCE     Medication Adherence    Patient reported X missed doses in the last month: 0  Specialty Medication: biktarvy 50-200-25mg                           Were doses missed due to medication being on hold? No    biktarvy 50-200-25mg   : unable to confirm quantity on hand    REFERRAL TO PHARMACIST     Referral to the pharmacist: Not needed      [facility]     Shipping address confirmed in Epic.     Delivery Scheduled: Yes, Expected medication delivery date: 11/15.     Medication will be delivered via Next Day Courier to the prescription address in Epic WAM.    [name]   [facility] Specialty Technician

## 2022-05-04 MED FILL — BIKTARVY 50 MG-200 MG-25 MG TABLET: ORAL | 30 days supply | Qty: 30 | Fill #5

## 2022-06-11 NOTE — Unmapped (Signed)
The [facility] has made a second and final attempt to reach this patient to refill the following medication:Biktarvy.      We have been unable to leave messages on the following phone numbers: [phone] and [phone] and have sent a Mychart questionnaire..    Dates contacted: 12/8 and 12/22  Last scheduled delivery: 05/05/22    The patient may be at risk of non-compliance with this medication. The patient should call the [facility] at [phone]  Option 4, then Option 2 (all other specialty patients) to refill medication.    [name]   [facility] Specialty Technician

## 2022-06-16 DIAGNOSIS — M25562 Pain in left knee: Principal | ICD-10-CM

## 2022-06-16 NOTE — Unmapped (Signed)
Pt requesting PT for left knee.   s/p left knee arthroscopic partial medial menisectomy, date of surgery 03/02/2022.

## 2022-06-22 NOTE — Unmapped (Signed)
[facility] Refill Coordination Note    Specialty Medication(s) to be Shipped:   Infectious Disease: Biktarvy    Other medication(s) to be shipped: No additional medications requested for fill at this time     Shelley Beck, DOB: [birth year]  Phone: [phone] (home)       All above HIPAA information was verified with patient.     Was a Nurse, learning disability used for this call? No    Completed refill call assessment today to schedule patient's medication shipment from the [facility] [phone]).  All relevant notes have been reviewed.     Specialty medication(s) and dose(s) confirmed: Regimen is correct and unchanged.   Changes to medications: [name] reports no changes at this time.  Changes to insurance: No  New side effects reported not previously addressed with a pharmacist or physician: None reported  Questions for the pharmacist: No    Confirmed patient received a Conservation officer, historic buildings and a Surveyor, mining with first shipment. The patient will receive a drug information handout for each medication shipped and additional FDA Medication Guides as required.       DISEASE/MEDICATION-SPECIFIC INFORMATION        N/A    SPECIALTY MEDICATION ADHERENCE     Medication Adherence    Patient reported X missed doses in the last month: 0  Specialty Medication: BIKTARVY 50-200-25 mg tablet (bictegrav-emtricit-tenofov ala)  Patient is on additional specialty medications: No                                Were doses missed due to medication being on hold? No    biktarvy 50-200-25mg   7 days worth of medication on hand.      REFERRAL TO PHARMACIST     Referral to the pharmacist: Not needed      [facility]     Shipping address confirmed in Epic.     Delivery Scheduled: Yes, Expected medication delivery date: 06/24/22.     Medication will be delivered via Same Day Courier to the prescription address in Epic WAM.    Shelley Beck   [facility] Specialty Technician

## 2022-06-24 MED FILL — BIKTARVY 50 MG-200 MG-25 MG TABLET: ORAL | 30 days supply | Qty: 30 | Fill #6

## 2022-06-27 DIAGNOSIS — R7303 Prediabetes: Principal | ICD-10-CM

## 2022-06-27 MED ORDER — METFORMIN ER 750 MG TABLET,EXTENDED RELEASE 24 HR
ORAL_TABLET | 0 refills | 0 days
Start: 2022-06-27 — End: ?

## 2022-06-28 DIAGNOSIS — R7303 Prediabetes: Principal | ICD-10-CM

## 2022-06-28 MED ORDER — METFORMIN ER 750 MG TABLET,EXTENDED RELEASE 24 HR
ORAL_TABLET | Freq: Every day | ORAL | 0 refills | 90 days | Status: CP
Start: 2022-06-28 — End: 2022-09-26

## 2022-07-23 NOTE — Unmapped (Addendum)
[facility] Refill Coordination Note    Specialty Medication(s) to be Shipped:   Infectious Disease: Biktarvy    Other medication(s) to be shipped: No additional medications requested for fill at this time     Shelley Beck, DOB: 1975-08-20  Phone: [phone] (home)       All above HIPAA information was verified with patient.     Was a Nurse, learning disability used for this call? No    Completed refill call assessment today to schedule patient's medication shipment from the [facility] [phone]).  All relevant notes have been reviewed.     Specialty medication(s) and dose(s) confirmed: Regimen is correct and unchanged.   Changes to medications: Grisell reports no changes at this time.  Changes to insurance: No  New side effects reported not previously addressed with a pharmacist or physician: None reported  Questions for the pharmacist: No    Confirmed patient received a Conservation officer, historic buildings and a Surveyor, mining with first shipment. The patient will receive a drug information handout for each medication shipped and additional FDA Medication Guides as required.       DISEASE/MEDICATION-SPECIFIC INFORMATION        N/A    SPECIALTY MEDICATION ADHERENCE     Medication Adherence    Patient reported X missed doses in the last month: 0  Specialty Medication: biktarvy  Patient is on additional specialty medications: No  Patient is on more than two specialty medications: No  Any gaps in refill history greater than 2 weeks in the last 3 months: no  Demonstrates understanding of importance of adherence: yes  Informant: patient  Reliability of informant: reliable  Provider-estimated medication adherence level: good  Patient is at risk for Non-Adherence: No  Reasons for non-adherence: no problems identified                  Confirmed plan for next specialty medication refill: delivery by pharmacy  Refills needed for supportive medications: not needed          Refill Coordination    Has the Patients' Contact Information Changed: No  Is the Shipping Address Different: No         Were doses missed due to medication being on hold? No    Biktarvy  50-200-25 mg: 7 days of medicine on hand       REFERRAL TO PHARMACIST     Referral to the pharmacist: Not needed      [facility]     Shipping address confirmed in Epic.     Delivery Scheduled: Yes, Expected medication delivery date: 02/07.     Medication will be delivered via Next Day Courier to the prescription address in Epic WAM.    Antonietta Barcelona   [facility] Specialty Technician

## 2022-07-28 DIAGNOSIS — Z21 Asymptomatic human immunodeficiency virus [HIV] infection status: Principal | ICD-10-CM

## 2022-07-28 DIAGNOSIS — R590 Localized enlarged lymph nodes: Principal | ICD-10-CM

## 2022-07-28 DIAGNOSIS — B20 Human immunodeficiency virus [HIV] disease: Principal | ICD-10-CM

## 2022-07-28 DIAGNOSIS — Z717 Human immunodeficiency virus [HIV] counseling: Principal | ICD-10-CM

## 2022-07-28 NOTE — Unmapped (Signed)
Shelley Beck 's biktarvy shipment will be delayed as a result of the patient's insurance being terminated.  MAPs seeking mfr assistance.    I have reached out to the patient  at [phone] and left a voicemail message.  We will call the patient back to reschedule the delivery upon resolution. We have not confirmed the new delivery date.

## 2022-07-28 NOTE — Unmapped (Signed)
-----   Message from Minus [place], [place] sent at 07/28/2022 10:38 AM EST -----  Call patient for an appointment to pick up Biktarvy samples today.

## 2022-07-28 NOTE — Unmapped (Signed)
Called patient. There is a message on her phone that she is not available, to call back later. No opportunity to leave a message.   I also called second # [phone] and that was her mom. Mom stated Shelley Beck is probably sleeping.

## 2022-07-29 NOTE — Unmapped (Signed)
Update on Delivery of her Biktarvy:    I called and spoke to her mother Oneta Rack to let her know Lynel's medication assistance has been approved.  Ms. Delma Freeze said it is ok for [place] to deliver her Biktarvy on 07/30/22 via the Same Day Courier to the verified prescription address in [place].    Corliss Skains. [place], [place].D.  Specialty Pharmacist  [facility]  [phone] option 4 then option 2

## 2022-07-30 MED FILL — BIKTARVY 50 MG-200 MG-25 MG TABLET: ORAL | 30 days supply | Qty: 30 | Fill #7

## 2022-08-05 NOTE — Unmapped (Signed)
The patient is calling the office requesting a work note for her new job stating the date of her surgery and that she is medially cleared to work with no restrictions. She is s/p left knee arthroscopic partial medial menisectomy, date of surgery 03/02/2022.

## 2022-08-27 NOTE — Unmapped (Signed)
The [facility] has made a second and final attempt to reach this patient to refill the following medication: Biktarvy.      We have left voicemails on the following phone numbers: [phone] and [phone], have been unable to leave messages on the following phone numbers: [phone], have sent a MyChart message, have sent a text message to the following phone numbers: [phone], and have sent a Mychart questionnaire..    Dates contacted: 2/28, 3/8  Last scheduled delivery: shipped 2/9    The patient may be at risk of non-compliance with this medication. The patient should call the [facility] at [phone]  Option 4, then Option 2 (all other specialty patients) to refill medication.    [name]   [facility] Specialty Technician

## 2022-09-20 NOTE — Unmapped (Signed)
[facility] Refill Coordination Note    Specialty Medication(s) to be Shipped:   Infectious Disease: Biktarvy    Other medication(s) to be shipped: No additional medications requested for fill at this time     Lonell Kramm, DOB: [birth year]  Phone: [phone] (home)       All above HIPAA information was verified with patient.     Was a Nurse, learning disability used for this call? No    Completed refill call assessment today to schedule patient's medication shipment from the [facility] [phone]).  All relevant notes have been reviewed.     Specialty medication(s) and dose(s) confirmed: Regimen is correct and unchanged.   Changes to medications: Marygrace reports no changes at this time.  Changes to insurance: No  New side effects reported not previously addressed with a pharmacist or physician: None reported  Questions for the pharmacist: No    Confirmed patient received a Conservation officer, historic buildings and a Surveyor, mining with first shipment. The patient will receive a drug information handout for each medication shipped and additional FDA Medication Guides as required.       DISEASE/MEDICATION-SPECIFIC INFORMATION        N/A    SPECIALTY MEDICATION ADHERENCE     Medication Adherence    Patient reported X missed doses in the last month: 0  Specialty Medication: BIKTARVY 50-200-25 mg  Patient is on additional specialty medications: No              Were doses missed due to medication being on hold? No    Biktarvy 50-200-25 mg: 5 days of medicine on hand        REFERRAL TO PHARMACIST     Referral to the pharmacist: Not needed      [facility]     Shipping address confirmed in Epic.     Patient was notified of new phone menu : No    Delivery Scheduled: Yes, Expected medication delivery date: 09/23/22.     Medication will be delivered via UPS to the prescription address in Epic WAM.    Willette Pa   [facility] Specialty Technician

## 2022-09-22 MED FILL — BIKTARVY 50 MG-200 MG-25 MG TABLET: ORAL | 30 days supply | Qty: 30 | Fill #8

## 2022-11-02 ENCOUNTER — Ambulatory Visit: Admit: 2022-11-02 | Discharge: 2022-11-03 | Payer: PRIVATE HEALTH INSURANCE

## 2022-11-02 ENCOUNTER — Ambulatory Visit: Admit: 2022-11-02 | Discharge: 2022-11-03 | Payer: PRIVATE HEALTH INSURANCE | Attending: Family | Primary: Family

## 2022-11-02 DIAGNOSIS — B20 Human immunodeficiency virus [HIV] disease: Principal | ICD-10-CM

## 2022-11-02 DIAGNOSIS — R7303 Prediabetes: Principal | ICD-10-CM

## 2022-11-02 DIAGNOSIS — M25562 Pain in left knee: Principal | ICD-10-CM

## 2022-11-02 DIAGNOSIS — G8929 Other chronic pain: Principal | ICD-10-CM

## 2022-11-02 DIAGNOSIS — Z717 Human immunodeficiency virus [HIV] counseling: Principal | ICD-10-CM

## 2022-11-02 DIAGNOSIS — Z79899 Other long term (current) drug therapy: Principal | ICD-10-CM

## 2022-11-02 DIAGNOSIS — Z6841 Body Mass Index (BMI) 40.0 and over, adult: Principal | ICD-10-CM

## 2022-11-02 LAB — CBC W/ AUTO DIFF
BASOPHILS ABSOLUTE COUNT: 0 10*9/L (ref 0.0–0.1)
BASOPHILS RELATIVE PERCENT: 0.7 %
EOSINOPHILS ABSOLUTE COUNT: 0.2 10*9/L (ref 0.0–0.5)
EOSINOPHILS RELATIVE PERCENT: 2.9 %
HEMATOCRIT: 37.4 % (ref 34.0–44.0)
HEMOGLOBIN: 12 g/dL (ref 11.3–14.9)
LYMPHOCYTES ABSOLUTE COUNT: 1.5 10*9/L (ref 1.1–3.6)
LYMPHOCYTES RELATIVE PERCENT: 27.1 %
MEAN CORPUSCULAR HEMOGLOBIN CONC: 32.2 g/dL (ref 32.0–36.0)
MEAN CORPUSCULAR HEMOGLOBIN: 26 pg (ref 25.9–32.4)
MEAN CORPUSCULAR VOLUME: 80.8 fL (ref 77.6–95.7)
MEAN PLATELET VOLUME: 8.1 fL (ref 6.8–10.7)
MONOCYTES ABSOLUTE COUNT: 0.7 10*9/L (ref 0.3–0.8)
MONOCYTES RELATIVE PERCENT: 11.8 %
NEUTROPHILS ABSOLUTE COUNT: 3.2 10*9/L (ref 1.8–7.8)
NEUTROPHILS RELATIVE PERCENT: 57.5 %
PLATELET COUNT: 274 10*9/L (ref 150–450)
RED BLOOD CELL COUNT: 4.64 10*12/L (ref 3.95–5.13)
RED CELL DISTRIBUTION WIDTH: 15.8 % — ABNORMAL HIGH (ref 12.2–15.2)
WBC ADJUSTED: 5.6 10*9/L (ref 3.6–11.2)

## 2022-11-02 LAB — COMPREHENSIVE METABOLIC PANEL
ALBUMIN: 4 g/dL (ref 3.4–5.0)
ALKALINE PHOSPHATASE: 71 U/L (ref 46–116)
ALT (SGPT): 18 U/L (ref 10–49)
ANION GAP: 8 mmol/L (ref 3–11)
AST (SGOT): 27 U/L (ref <34)
BILIRUBIN TOTAL: 0.3 mg/dL (ref 0.3–1.2)
BLOOD UREA NITROGEN: 13 mg/dL (ref 9–23)
BUN / CREAT RATIO: 16
CALCIUM: 9.8 mg/dL (ref 8.7–10.4)
CHLORIDE: 104 mmol/L (ref 98–107)
CO2: 29 mmol/L (ref 20.0–31.0)
CREATININE: 0.79 mg/dL (ref 0.55–1.02)
EGFR CKD-EPI (2021) FEMALE: >90 mL/min/{1.73_m2} (ref >=60)
GLUCOSE RANDOM: 81 mg/dL (ref 70–179)
POTASSIUM: 4.1 mmol/L (ref 3.5–5.1)
PROTEIN TOTAL: 7.3 g/dL (ref 5.7–8.2)
SODIUM: 141 mmol/L (ref 136–145)

## 2022-11-02 MED ORDER — WEGOVY 0.25 MG/0.5 ML SUBCUTANEOUS PEN INJECTOR
SUBCUTANEOUS | 0 refills | 28 days | Status: CP
Start: 2022-11-02 — End: 2022-11-30

## 2022-11-02 NOTE — Unmapped (Signed)
Insurance needs confirmation. I called [employer] Unknown Jim) @ [phone] to confirm her insurance. I got a recording that ask to call back between 8:30 am - 5 pm M - F. I have marked insurance as needs to be verified.

## 2022-11-02 NOTE — Unmapped (Signed)
Assessment/Plan:      Diagnosis ICD-10-CM Associated Orders   1. HIV disease (CMS-HCC)  B20 CBC     Metabolic panel, Comp (Chem10 + LFTs)     HIV RNA, Quantitative, PCR     Lymphocyte Markers Limited     Syphilis Screen     WEGOVY 0.25 MG/0.5 ML SUBCUTANEOUS PEN INJECTOR      2. Prediabetes  R73.03 CBC     Metabolic panel, Comp (Chem10 + LFTs)     HIV RNA, Quantitative, PCR     Lymphocyte Markers Limited     Syphilis Screen     WEGOVY 0.25 MG/0.5 ML SUBCUTANEOUS PEN INJECTOR      3. Chronic pain of left knee  M25.562 CBC    G89.29 Metabolic panel, Comp (Chem10 + LFTs)     HIV RNA, Quantitative, PCR     Lymphocyte Markers Limited     Syphilis Screen     WEGOVY 0.25 MG/0.5 ML SUBCUTANEOUS PEN INJECTOR      4. Encounter for HIV counseling  Z71.7 CBC     Metabolic panel, Comp (Chem10 + LFTs)     HIV RNA, Quantitative, PCR     Lymphocyte Markers Limited     Syphilis Screen     WEGOVY 0.25 MG/0.5 ML SUBCUTANEOUS PEN INJECTOR      5. On highly active antiretroviral therapy (HAART)  Z79.899 CBC     Metabolic panel, Comp (Chem10 + LFTs)     HIV RNA, Quantitative, PCR     Lymphocyte Markers Limited     Syphilis Screen     WEGOVY 0.25 MG/0.5 ML SUBCUTANEOUS PEN INJECTOR      6. Morbid obesity with BMI of 40.0-44.9, adult (CMS-HCC)  E66.01 CBC    Z68.41 Metabolic panel, Comp (Chem10 + LFTs)     HIV RNA, Quantitative, PCR     Lymphocyte Markers Limited     Syphilis Screen     WEGOVY 0.25 MG/0.5 ML SUBCUTANEOUS PEN INJECTOR          Return in about 3 months (around 02/02/2023).    HPI:     Shelley Beck is a 47 y.o. year old Black/African American female who presents today for HIV follow-up. Referred by [facility] for new HIV diagnosis. Patient is a beautiful, intelligent travel nurse. Working at [employer] right now. Had expereienced cervical lymphadenopathy, swollen tonsils, and fatigue for the last few months, has resolved since starting HAART Biktarvy.  Denies fever, chills, NVD, night sweats. Went in for routine labs and HIV1 antibody returned positive. Her boyfriend, Shelley Beck, has been tested and is HIV positive also, on Biktarvy. CD4% 42 Absolute CD4 756 HIV RNA not detected. Hep ABC negative, RPR non-reactive. CMV positive.  Discussed HIV biology, medication resistance, medication compliance, safe sex with condoms, U=U.  Patient has been taking HAART Biktarvy with no missed doses or bothersome side effects.  Potential medication side effects discussed. HIV labs today.      Patient is prediabetic, on Metformn 750 mg daily, refill sent today. Has followed healthy diet and exercise routines for many years without much weight loss. Eats same amount today with less energy expended so he has gained weight.  Has not tried any weight loss medications previously.  BMI 41.10 232 pounds. Patient has tried dietary and lifestyle modifications including calorie restrictions, weight loss behavior modifications, and increased physical activity for more than six months and patient has been unable to lose 5% of body weight.  It  is medically necessary for patient to add pharmacotherapy to their obesity treatment plan since they have been unable to achieve clinically significant results with active enrollment and participation in lifestyle changes. Patient will continue lifestyle modifications in addition to starting pharmacotherapy to treat their obesity, including reduced calorie diet as specified below, increased physical activity as detailed below and behavioral changes. Patient's weight prior to starting medication to treat their obesity was 223 pounds, pharmacotherapy is recommended following AACE guidelines. Will start patient on Wegovy 0.25 mg/ 0.5 ml. Discussed potential medication side effects including dry mouth, nausea, constipation. Patient is familiar with injection technique. Denies medullary tyroid cancer history. Follow up in 3 months.     Past Medical/Surgical History:     Past Medical History:   Diagnosis Date Anxiety     managed on lexapro    HIV disease (CMS-HCC)     Neck pain      Past Surgical History:   Procedure Laterality Date    CHOLECYSTECTOMY      PR KNEE SCOPE,MED/LAT MENISECTOMY Left 03/02/2022    Procedure: KNEE ARTHROSCOPY PARTIAL MEDIAL MENISECTOMY;  Surgeon: Mcneil Sober, MD;  Location: [facility];  Service: Orthopedics    scoliosis repair      uterine ablation         Social History:     Social History     Socioeconomic History    Marital status: Divorced     Spouse name: None    Number of children: None    Years of education: None    Highest education level: None   Tobacco Use    Smoking status: Never    Smokeless tobacco: Never    Tobacco comments:     Vape occasionally    Vaping Use    Vaping status: Every Day    Substances: Nicotine, Flavoring   Substance and Sexual Activity    Alcohol use: Yes     Alcohol/week: 1.0 standard drink of alcohol     Types: 1 Glasses of wine per week     Comment: socially    Drug use: Never    Sexual activity: Yes     Partners: Male       Family History:     Family History   Problem Relation Age of Onset    No Known Problems Mother     No Known Problems Father        Allergies:     Tramadol    Current Medications:     Current Outpatient Medications   Medication Sig Dispense Refill    ascorbic acid, vitamin C, (VITAMIN C) 1000 MG tablet Take 2 tablets (2,000 mg total) by mouth daily.      bictegrav-emtricit-tenofov ala (BIKTARVY) 50-200-25 mg tablet Take 1 tablet by mouth daily. 30 tablet 11    calcium carbonate (OS-CAL) 1,500 mg (600 mg elem calcium) tablet Take 1 tablet (600 mg elem calcium total) by mouth daily.      cyclobenzaprine (FLEXERIL) 10 MG tablet TAKE 1 TABLET BY MOUTH TWICE DAILY AS NEEDED FOR MUSCLE SPASM      escitalopram oxalate (LEXAPRO) 20 MG tablet Take 1 tablet (20 mg total) by mouth daily. 90 tablet 1    hydrOXYzine (ATARAX) 25 MG tablet TAKE 1 TO 2 TABLETS BY MOUTH THREE TIMES DAILY AS NEEDED FOR ANXIETY      ibuprofen (MOTRIN) 800 MG tablet TAKE 1 TABLET BY MOUTH THREE TIMES DAILY FOR 10 DAYS      naltrexone  HCl (NALTREXONE ORAL) Take 0.1 mg by mouth.      ondansetron (ZOFRAN-ODT) 4 MG disintegrating tablet DISSOLVE ONE TABLET ON TONGUE EVERY 8 HRS AS NEEDED FOR NAUSEA      metFORMIN (GLUCOPHAGE-XR) 750 MG 24 hr tablet Take 1 tablet (750 mg total) by mouth daily before breakfast. 90 tablet 0    WEGOVY 0.25 MG/0.5 ML SUBCUTANEOUS PEN INJECTOR Inject 0.25 mg under the skin every seven (7) days for 28 days. 2 mL 0     Current Facility-Administered Medications   Medication Dose Route Frequency Provider Last Rate Last Admin    povidone-iodine 10 % swab 1 application.  1 application. Each Nare Once Mcneil Sober, MD           ROS:   Review of Systems   Constitutional: Negative.    HENT: Negative.     Eyes: Negative.    Respiratory: Negative.     Cardiovascular: Negative.    Gastrointestinal: Negative.    Endocrine: Negative.    Genitourinary: Negative.    Musculoskeletal: Negative.    Skin: Negative.    Allergic/Immunologic: Positive for immunocompromised state.   Neurological: Negative.    Hematological: Negative.    Psychiatric/Behavioral: Negative.          Vital Signs:     Vitals:    11/02/22 0802   BP: 122/81   Pulse: 77   Resp: 17   Temp: 36.1 ??C (96.9 ??F)   SpO2: 98%     BP Readings from Last 3 Encounters:   11/02/22 122/81   04/15/22 125/80   03/17/22 127/85     Wt Readings from Last 3 Encounters:   11/02/22 (!) 105.2 kg (232 lb)   04/15/22 (!) 101.2 kg (223 lb)   03/17/22 (!) 101.2 kg (223 lb)       Immunizations:     Immunization History   Administered Date(s) Administered    COVID-19 VACC,MRNA,(PFIZER)(PF) 07/24/2019, 08/14/2019, 04/02/2020    Influenza Virus Vaccine, unspecified formulation 03/13/2017    PPD Test 01/16/2017, 03/26/2020, 10/28/2020, 12/01/2020    TdaP 10/28/2020       Labs:     Platelet   Date Value Ref Range Status   02/15/2022 277 150 - 450 10*9/L Final     HGB   Date Value Ref Range Status   02/15/2022 11.8 11.3 - 14.9 g/dL Final     WBC   Date Value Ref Range Status   02/15/2022 5.6 3.6 - 11.2 10*9/L Final     BUN   Date Value Ref Range Status   02/15/2022 9 9 - 23 mg/dL Final     Creatinine   Date Value Ref Range Status   02/15/2022 0.80 0.55 - 1.02 mg/dL Final     ALT   Date Value Ref Range Status   02/15/2022 13 10 - 49 U/L Final     AST   Date Value Ref Range Status   02/15/2022 17 <34 U/L Final     Potassium   Date Value Ref Range Status   02/15/2022 4.0 3.5 - 5.1 mmol/L Final     CO2   Date Value Ref Range Status   02/15/2022 27.0 20.0 - 31.0 mmol/L Final     HIV RNA   Date Value Ref Range Status   11/24/2020 709 (H) <0 copies/mL Final     Hepatitis C Ab   Date Value Ref Range Status   10/24/2020 Nonreactive Nonreactive Final     Gonorrhoeae NAA  Date Value Ref Range Status   10/24/2020 Negative Negative Final     Chlamydia trachomatis, NAA   Date Value Ref Range Status   10/24/2020 Negative Negative Final     Urine Culture, Comprehensive   Date Value Ref Range Status   12/12/2018 50,000 to 100,000 CFU/mL Klebsiella pneumoniae (A)  Final   12/12/2018 <10,000 CFU/mL Normal Urogenital Flora  Final     CD4% (T Helper)   Date Value Ref Range Status   09/16/2021 42 34 - 58 % Final         Physical Exam:   Physical Exam  Vitals and nursing note reviewed.   Constitutional:       Appearance: Normal appearance. She is normal weight.   HENT:      Head: Normocephalic and atraumatic.      Right Ear: External ear normal.      Left Ear: External ear normal.      Nose: Nose normal.      Mouth/Throat:      Mouth: Mucous membranes are moist.      Pharynx: Oropharynx is clear.   Eyes:      Extraocular Movements: Extraocular movements intact.      Conjunctiva/sclera: Conjunctivae normal.      Pupils: Pupils are equal, round, and reactive to light.   Cardiovascular:      Rate and Rhythm: Normal rate and regular rhythm.      Pulses: Normal pulses.      Heart sounds: Normal heart sounds.   Pulmonary:      Effort: Pulmonary effort is normal.      Breath sounds: Normal breath sounds.   Abdominal:      General: Abdomen is flat. Bowel sounds are normal.      Palpations: Abdomen is soft.   Musculoskeletal:         General: Normal range of motion.      Cervical back: Normal range of motion and neck supple.   Skin:     General: Skin is warm and dry.      Capillary Refill: Capillary refill takes less than 2 seconds.   Neurological:      General: No focal deficit present.      Mental Status: She is alert and oriented to person, place, and time.   Psychiatric:         Mood and Affect: Mood normal.         Behavior: Behavior normal.         Thought Content: Thought content normal.         Judgment: Judgment normal.

## 2022-11-03 LAB — LYMPH MARKER LIMITED,FLOW
ABSOLUTE CD3 CNT: 1035 {cells}/uL (ref 915–3400)
ABSOLUTE CD4 CNT: 630 {cells}/uL (ref 510–2320)
ABSOLUTE CD8 CNT: 390 {cells}/uL (ref 180–1520)
CD3% (T CELLS): 69 % (ref 61–86)
CD4% (T HELPER): 42 % (ref 34–58)
CD4:CD8 RATIO: 1.6 (ref 0.9–4.8)
CD8% T SUPPRESR: 26 % (ref 12–38)

## 2022-11-03 LAB — SYPHILIS SCREEN: SYPHILIS AB IGG/IGM SCREEN: NEGATIVE

## 2022-11-04 LAB — HIV RNA, QUANTITATIVE, PCR
HIV RNA QNT RSLT: DETECTED — AB
HIV RNA: <20 {copies}/mL — ABNORMAL HIGH (ref <0)

## 2022-11-09 NOTE — Unmapped (Signed)
[facility] Refill Coordination Note    Specialty Medication(s) to be Shipped:   Infectious Disease: Biktarvy    Other medication(s) to be shipped: No additional medications requested for fill at this time     Shelley Beck, DOB: 17-Jul-1975  Phone: [phone] (home)       All above HIPAA information was verified with patient.     Was a Nurse, learning disability used for this call? No    Completed refill call assessment today to schedule patient's medication shipment from the [facility] [phone]).  All relevant notes have been reviewed.     Specialty medication(s) and dose(s) confirmed: Regimen is correct and unchanged.   Changes to medications: Poppi reports no changes at this time.  Changes to insurance: No  New side effects reported not previously addressed with a pharmacist or physician: None reported  Questions for the pharmacist: No    Confirmed patient received a Conservation officer, historic buildings and a Surveyor, mining with first shipment. The patient will receive a drug information handout for each medication shipped and additional FDA Medication Guides as required.       DISEASE/MEDICATION-SPECIFIC INFORMATION        N/A    SPECIALTY MEDICATION ADHERENCE     Medication Adherence    Patient reported X missed doses in the last month: 0  Specialty Medication: BIKTARVY 50-200-25 mg tablet (bictegrav-emtricit-tenofov ala)  Patient is on additional specialty medications: No  Patient is on more than two specialty medications: No  Any gaps in refill history greater than 2 weeks in the last 3 months: no  Demonstrates understanding of importance of adherence: yes  Informant: patient  Reliability of informant: reliable  Provider-estimated medication adherence level: good  Patient is at risk for Non-Adherence: No  Reasons for non-adherence: no problems identified  Confirmed plan for next specialty medication refill: delivery by pharmacy  Refills needed for supportive medications: not needed Refill Coordination    Has the Patients' Contact Information Changed: No  Is the Shipping Address Different: No         Were doses missed due to medication being on hold? No    BIKTARVY 50-200-25 mg tablet (bictegrav-emtricit-tenofov ala)  : 2 days of medicine on hand       REFERRAL TO PHARMACIST     Referral to the pharmacist: Not needed      [facility]     Shipping address confirmed in Epic.       Delivery Scheduled: Yes, Expected medication delivery date: 05/22.     Medication will be delivered via Same Day Courier to the prescription address in Epic WAM.    Antonietta Barcelona   [facility] Specialty Technician

## 2022-11-10 DIAGNOSIS — R590 Localized enlarged lymph nodes: Principal | ICD-10-CM

## 2022-11-10 DIAGNOSIS — Z21 Asymptomatic human immunodeficiency virus [HIV] infection status: Principal | ICD-10-CM

## 2022-11-10 DIAGNOSIS — B20 Human immunodeficiency virus [HIV] disease: Principal | ICD-10-CM

## 2022-11-10 DIAGNOSIS — Z717 Human immunodeficiency virus [HIV] counseling: Principal | ICD-10-CM

## 2022-11-10 MED ORDER — BICTEGRAVIR 50 MG-EMTRICITABINE 200 MG-TENOFOVIR ALAFENAM 25 MG TABLET
ORAL_TABLET | Freq: Every day | ORAL | 3 refills | 30 days | Status: CP
Start: 2022-11-10 — End: 2022-11-10
  Filled 2022-11-11: qty 30, 30d supply, fill #0

## 2022-11-10 MED ORDER — BIKTARVY 50 MG-200 MG-25 MG TABLET
ORAL_TABLET | Freq: Every day | ORAL | 11 refills | 30.00000 days | Status: CN
Start: 2022-11-10 — End: 2023-11-10

## 2022-11-10 NOTE — Unmapped (Signed)
Shelley Beck 's Biktarvy shipment will be delayed as a result of prescription was scheduled, but expired before the delivery date.      I have reached out to the patient  at [phone] and was unable to leave a message. We will call the patient back to reschedule the delivery upon resolution. We have not confirmed the new delivery date.

## 2022-11-10 NOTE — Unmapped (Signed)
Addended by: Winnifred Friar on: 11/10/2022 04:29 PM     Modules accepted: Orders

## 2022-11-10 NOTE — Unmapped (Signed)
Addended by: Winnifred Friar on: 11/10/2022 02:58 PM     Modules accepted: Orders

## 2022-11-11 NOTE — Unmapped (Signed)
Update of Delivery of Biktarvy from the [facility]:    I called and spoke to Ms. Bradfield' domestic partner, [name], regarding the delivery of Shelley Beck that was originally scheduled to be delivered on 11/10/22.    I confirmed that it is ok to deliver her Biktarvy on 11/11/22 via our Same Day Courier to the verified prescription address in [place].    Corliss Skains. [place], [place].D.  Specialty Pharmacist  [facility]  [phone] option 4,  then option 4 again

## 2022-12-15 DIAGNOSIS — M1711 Unilateral primary osteoarthritis, right knee: Principal | ICD-10-CM

## 2022-12-15 DIAGNOSIS — B20 Human immunodeficiency virus [HIV] disease: Principal | ICD-10-CM

## 2022-12-15 DIAGNOSIS — R7303 Prediabetes: Principal | ICD-10-CM

## 2022-12-15 MED ORDER — WEGOVY 0.5 MG/0.5 ML SUBCUTANEOUS PEN INJECTOR
SUBCUTANEOUS | 0 refills | 28 days | Status: CP
Start: 2022-12-15 — End: 2023-01-12

## 2022-12-15 NOTE — Unmapped (Signed)
[facility] Refill Coordination Note    Specialty Medication(s) to be Shipped:   Infectious Disease: Biktarvy    Other medication(s) to be shipped: No additional medications requested for fill at this time     Hila Bolding, DOB: [birth year]  Phone: [phone] (home)       All above HIPAA information was verified with patient.     Was a Nurse, learning disability used for this call? No    Completed refill call assessment today to schedule patient's medication shipment from the [facility] [phone]).  All relevant notes have been reviewed.     Specialty medication(s) and dose(s) confirmed: Regimen is correct and unchanged.   Changes to medications: Jaliya reports no changes at this time.  Changes to insurance: No  New side effects reported not previously addressed with a pharmacist or physician: None reported  Questions for the pharmacist: No    Confirmed patient received a Conservation officer, historic buildings and a Surveyor, mining with first shipment. The patient will receive a drug information handout for each medication shipped and additional FDA Medication Guides as required.       DISEASE/MEDICATION-SPECIFIC INFORMATION        N/A    SPECIALTY MEDICATION ADHERENCE     Medication Adherence    Patient reported X missed doses in the last month: 0  Specialty Medication: BIKTARVY 50-200-25 mg tablet (bictegrav-emtricit-tenofov ala)  Patient is on additional specialty medications: No              Were doses missed due to medication being on hold? No    Biktarvy 50-200-25 mg: 2 days of medicine on hand       REFERRAL TO PHARMACIST     Referral to the pharmacist: Not needed      [facility]     Shipping address confirmed in Epic.       Delivery Scheduled: Yes, Expected medication delivery date: 12/16/22.     Medication will be delivered via Same Day Courier to the prescription address in Epic WAM.    Quintella Reichert   [facility] Specialty Technician

## 2022-12-15 NOTE — Unmapped (Signed)
Need next dose (increases this time) of Wegovy to [facility]

## 2022-12-16 MED FILL — BIKTARVY 50 MG-200 MG-25 MG TABLET: ORAL | 30 days supply | Qty: 30 | Fill #1

## 2023-01-18 NOTE — Unmapped (Signed)
[facility] Clinical Assessment & Refill Coordination Note    Shelley Beck, Victor: Jul 28, 1975  Phone: [phone] (home)     All above HIPAA information was verified with patient's family member, Shelley Beck (domestic partner).     Was a Nurse, learning disability used for this call? No    Specialty Medication(s):   Infectious Disease: Biktarvy     Current Outpatient Medications   Medication Sig Dispense Refill    ascorbic acid, vitamin C, (VITAMIN C) 1000 MG tablet Take 2 tablets (2,000 mg total) by mouth daily.      bictegrav-emtricit-tenofov ala (BIKTARVY) 50-200-25 mg tablet Take 1 tablet by mouth daily. 30 tablet 3    calcium carbonate (OS-CAL) 1,500 mg (600 mg elem calcium) tablet Take 1 tablet (600 mg elem calcium total) by mouth daily.      cyclobenzaprine (FLEXERIL) 10 MG tablet TAKE 1 TABLET BY MOUTH TWICE DAILY AS NEEDED FOR MUSCLE SPASM      escitalopram oxalate (LEXAPRO) 20 MG tablet Take 1 tablet (20 mg total) by mouth daily. 90 tablet 1    hydrOXYzine (ATARAX) 25 MG tablet TAKE 1 TO 2 TABLETS BY MOUTH THREE TIMES DAILY AS NEEDED FOR ANXIETY      ibuprofen (MOTRIN) 800 MG tablet TAKE 1 TABLET BY MOUTH THREE TIMES DAILY FOR 10 DAYS      metFORMIN (GLUCOPHAGE-XR) 750 MG 24 hr tablet Take 1 tablet (750 mg total) by mouth daily before breakfast. 90 tablet 0    naltrexone HCl (NALTREXONE ORAL) Take 0.1 mg by mouth.      ondansetron (ZOFRAN-ODT) 4 MG disintegrating tablet DISSOLVE ONE TABLET ON TONGUE EVERY 8 HRS AS NEEDED FOR NAUSEA       Current Facility-Administered Medications   Medication Dose Route Frequency Provider Last Rate Last Admin    povidone-iodine 10 % swab 1 application.  1 application. Each Nare Once Mcneil Sober, MD            Changes to medications:  no changes    Allergies   Allergen Reactions    Tramadol Other (See Comments)     Severe headache       Changes to allergies: No    SPECIALTY MEDICATION ADHERENCE     Biktarvy 50-200-25 mg: approximately 7 days of medicine on hand     Medication Adherence    Patient reported X missed doses in the last month: 0  Specialty Medication: Biktarvy50-200-25mg   Patient is on additional specialty medications: No  Any gaps in refill history greater than 2 weeks in the last 3 months: no  Demonstrates understanding of importance of adherence: yes  Informant: spouse  Provider-estimated medication adherence level: good  Patient is at risk for Non-Adherence: No          Specialty medication(s) dose(s) confirmed: Regimen is correct and unchanged.     Are there any concerns with adherence? No    Adherence counseling provided? Not needed    CLINICAL MANAGEMENT AND INTERVENTION      Clinical Benefit Assessment:    Do you feel the medicine is effective or helping your condition? Yes    HIV ASSOCIATED LABS:     Lab Results   Component Value Date/Time    HIVRS Detected (A) 11/02/2022 08:59 AM    HIVRS Not Detected 09/16/2021 11:11 AM    HIVRS Detected (A) 11/24/2020 12:22 PM    HIVCP <20 (H) 11/02/2022 08:59 AM    HIVCP 709 (H) 11/24/2020 12:22 PM  HIVCP 308,657 (H) 10/24/2020 11:17 AM    ACD4 630 11/02/2022 08:59 AM    ACD4 756 09/16/2021 11:11 AM    ACD4 525 11/24/2020 12:22 PM       Clinical Benefit counseling provided? Labs from 11/02/22 show evidence of clinical benefit    Adverse Effects Assessment:    Are you experiencing any side effects? No    Are you experiencing difficulty administering your medicine? No    Quality of Life Assessment:    How many days over the past month did your HIV  keep you from your normal activities? For example, brushing your teeth or getting up in the morning. 0    Have you discussed this with your provider? Not needed    Acute Infection Status:    Acute infections noted within Epic:  No active infections  Patient reported infection: None    Therapy Appropriateness:    Is therapy appropriate and patient progressing towards therapeutic goals? Yes, therapy is appropriate and should be continued    DISEASE/MEDICATION-SPECIFIC INFORMATION      N/A    HIV: Not Applicable    PATIENT SPECIFIC NEEDS     Does the patient have any physical, cognitive, or cultural barriers? No    Is the patient high risk? No    Did the patient require a clinical intervention? No    Does the patient require physician intervention or other additional services (i.e., nutrition, smoking cessation, social work)? No    SOCIAL DETERMINANTS OF HEALTH     At the [facility], we have learned that life circumstances - like trouble affording food, housing, utilities, or transportation can affect the health of many of our patients.   That is why we wanted to ask: are you currently experiencing any life circumstances that are negatively impacting your health and/or quality of life? Patient declined to answer    Social Determinants of Health     Financial Resource Strain: Not on file   Internet Connectivity: Not on file   Food Insecurity: Not on file   Tobacco Use: Low Risk  (11/02/2022)    Patient History     Smoking Tobacco Use: Never     Smokeless Tobacco Use: Never     Passive Exposure: Not on file   Housing/Utilities: Not on file   Alcohol Use: Not At Risk (06/21/2019)    Received from [facility]    AUDIT-C     Frequency of Alcohol Consumption: Never     Average Number of Drinks: Not on file     Frequency of Binge Drinking: Not on file   Transportation Needs: Not on file   Substance Use: Not on file   Health Literacy: Low Risk  (10/27/2021)    Health Literacy     : Never   Physical Activity: Not on file   Interpersonal Safety: Unknown (01/18/2023)    Interpersonal Safety     Unsafe Where You Currently Live: Not on file     Physically Hurt by Anyone: Not on file     Abused by Anyone: Not on file   Stress: Not on file   Intimate Partner Violence: Not on file   Depression: Not at risk (12/03/2021)    PHQ-2     PHQ-2 Score: 0   Social Connections: Not on file       Would you be willing to receive help with any of the needs that you have identified today? Not  applicable       SHIPPING     Specialty Medication(s) to be Shipped:   Infectious Disease: Biktarvy    Other medication(s) to be shipped: No additional medications requested for fill at this time     Changes to insurance: No    Delivery Scheduled: Yes, Expected medication delivery date: 01/20/23.     Medication will be delivered via Next Day Courier to the confirmed prescription address in [facility].    The patient will receive a drug information handout for each medication shipped and additional FDA Medication Guides as required.  Verified that patient has previously received a Conservation officer, historic buildings and a Surveyor, mining.    The patient or caregiver noted above participated in the development of this care plan and knows that they can request review of or adjustments to the care plan at any time.      All of the patient's questions and concerns have been addressed.    Roderic Palau, PharmD   [facility] Specialty Pharmacist

## 2023-01-19 MED FILL — BIKTARVY 50 MG-200 MG-25 MG TABLET: ORAL | 30 days supply | Qty: 30 | Fill #2

## 2023-02-02 NOTE — Unmapped (Signed)
Cancelled appt via automated reminder. I called to reschedule. Message on phone not taking messages.

## 2023-02-23 NOTE — Unmapped (Signed)
[facility] Refill Coordination Note    Specialty Medication(s) to be Shipped:   Infectious Disease: Biktarvy    Other medication(s) to be shipped: No additional medications requested for fill at this time     Shelley Beck, DOB: [birth year]  Phone: [phone] (home)       All above HIPAA information was verified with patient's family member, spouse.     Was a Nurse, learning disability used for this call? No    Completed refill call assessment today to schedule patient's medication shipment from the [facility] [phone]).  All relevant notes have been reviewed.     Specialty medication(s) and dose(s) confirmed: Regimen is correct and unchanged.   Changes to medications: Zawadi reports no changes at this time.  Changes to insurance: No  New side effects reported not previously addressed with a pharmacist or physician: None reported  Questions for the pharmacist: No    Confirmed patient received a Conservation officer, historic buildings and a Surveyor, mining with first shipment. The patient will receive a drug information handout for each medication shipped and additional FDA Medication Guides as required.       DISEASE/MEDICATION-SPECIFIC INFORMATION        N/A    SPECIALTY MEDICATION ADHERENCE     Medication Adherence    Patient reported X missed doses in the last month: 0  Specialty Medication: BIKTARVY 50-200-25 mg tablet (bictegrav-emtricit-tenofov ala)  Patient is on additional specialty medications: No  Patient is on more than two specialty medications: No  Any gaps in refill history greater than 2 weeks in the last 3 months: no  Demonstrates understanding of importance of adherence: yes  Informant: patient  Confirmed plan for next specialty medication refill: delivery by pharmacy  Refills needed for supportive medications: not needed          Refill Coordination    Has the Patients' Contact Information Changed: No  Is the Shipping Address Different: No         Were doses missed due to medication being on hold? No    BIKTARVY 50-200-25   mg: 4 days of medicine on hand       REFERRAL TO PHARMACIST     Referral to the pharmacist: Not needed      [facility]     Shipping address confirmed in Epic.       Delivery Scheduled: Yes, Expected medication delivery date: 02/28/2023.     Medication will be delivered via Same Day Courier to the prescription address in Epic WAM.    [name]   [facility] Specialty Technician

## 2023-02-28 MED FILL — BIKTARVY 50 MG-200 MG-25 MG TABLET: ORAL | 30 days supply | Qty: 30 | Fill #3

## 2023-03-24 DIAGNOSIS — B20 Human immunodeficiency virus [HIV] disease: Principal | ICD-10-CM

## 2023-03-24 MED ORDER — BIKTARVY 50 MG-200 MG-25 MG TABLET
ORAL_TABLET | Freq: Every day | ORAL | 3 refills | 30 days | Status: CP
Start: 2023-03-24 — End: ?
  Filled 2023-03-28: qty 30, 30d supply, fill #0

## 2023-03-24 NOTE — Unmapped (Signed)
[facility] Special[facility] and Home Delivery Pharmacy Refill Coordination Note    Specialty Medication(s) to be Shipped:   Infectious Disease: Biktarvy    Other medication(s) to be shipped: No additional medications requested for fill at this time     Shelley Beck, DOB: October 10, 1975  Phone: [phone] (home)       All above HIPAA information was verified with patient's family member, spouse.     Was a Nurse, learning disability used for this call? No    Completed refill call assessment today to schedule patient's medication shipment from the [facility]  [phone]).  All relevant notes have been reviewed.     Specialty medication(s) and dose(s) confirmed: Regimen is correct and unchanged.   Changes to medications: Shelley Beck reports no changes at this time.  Changes to insurance: No  New side effects reported not previously addressed with a pharmacist or physician: None reported  Questions for the pharmacist: No    Confirmed patient received a Conservation officer, historic buildings and a Surveyor, mining with first shipment. The patient will receive a drug information handout for each medication shipped and additional FDA Medication Guides as required.       DISEASE/MEDICATION-SPECIFIC INFORMATION        N/A    SPECIALTY MEDICATION ADHERENCE     Medication Adherence    Patient reported X missed doses in the last month: 0  Specialty Medication: BIKTARVY 50-200-25 mg tablet (bictegrav-emtricit-tenofov ala)  Patient is on additional specialty medications: No  Patient is on more than two specialty medications: No  Any gaps in refill history greater than 2 weeks in the last 3 months: no  Demonstrates understanding of importance of adherence: yes  Informant: spouse  Confirmed plan for next specialty medication refill: delivery by pharmacy  Refills needed for supportive medications: not needed          Refill Coordination    Has the Patients' Contact Information Changed: No  Is the Shipping Address Different: No         Were doses missed due to medication being on hold? No    BIKTARVY 50-200-25   mg: 5 days of medicine on hand       REFERRAL TO PHARMACIST     Referral to the pharmacist: Not needed      [facility]     Shipping address confirmed in Epic.       Delivery Scheduled: Yes, Expected medication delivery date: 03/28/23.     Medication will be delivered via Same Day Courier to the prescription address in Epic WAM.    Shelley Beck   [facility] Special[facility] and Home Delivery Pharmacy  Specialty Technician

## 2023-05-05 NOTE — Unmapped (Signed)
[facility] has made a second and final attempt to reach this patient to refill the following medication:BIKTARVY 50-200-25 mg tablet (bictegrav-emtricit-tenofov ala).      We have left voicemails on the following phone numbers: [phone] and [phone] and have sent a text message to the following phone numbers: [phone] .    Dates contacted: 04/27/23 and 05/05/23  Last scheduled delivery: 03/28/23    The patient may be at risk of non-compliance with this medication. The patient should call the [facility] at [phone]  Option 4, then Option 4: Infectious Disease, Transplant to refill medication.    [name]   [facility] Specialty and [facility]

## 2023-05-05 NOTE — Unmapped (Signed)
[facility] Special[facility] and Home Delivery Pharmacy Refill Coordination Note    Specialty Medication(s) to be Shipped:   Infectious Disease: Biktarvy    Other medication(s) to be shipped: No additional medications requested for fill at this time     Shelley Beck, DOB: November 30, 1975  Phone: [phone] (home)       All above HIPAA information was verified with patient.     Was a Nurse, learning disability used for this call? No    Completed refill call assessment today to schedule patient's medication shipment from the [facility] and [facility] Delivery Pharmacy  [phone]).  All relevant notes have been reviewed.     Specialty medication(s) and dose(s) confirmed: Regimen is correct and unchanged.   Changes to medications: Shelley Beck reports no changes at this time.  Changes to insurance: No  New side effects reported not previously addressed with a pharmacist or physician: None reported  Questions for the pharmacist: No    Confirmed patient received a Conservation officer, historic buildings and a Surveyor, mining with first shipment. The patient will receive a drug information handout for each medication shipped and additional FDA Medication Guides as required.       DISEASE/MEDICATION-SPECIFIC INFORMATION        N/A    SPECIALTY MEDICATION ADHERENCE     Medication Adherence    Patient reported X missed doses in the last month: 0  Specialty Medication: BIKTARVY 50-200-25 mg tablet (bictegrav-emtricit-tenofov ala)  Patient is on additional specialty medications: No              Were doses missed due to medication being on hold? No    Biktarvy 50-200-25 mg: 4 days of medicine on hand       REFERRAL TO PHARMACIST     Referral to the pharmacist: Not needed      [facility]     Shipping address confirmed in Epic.       Delivery Scheduled: Yes, Expected medication delivery date: 05/09/23.     Medication will be delivered via Same Day Courier to the prescription address in Epic WAM.    Quintella Reichert   [facility] Specialty and Home Delivery Pharmacy  Specialty Technician

## 2023-05-09 MED FILL — BIKTARVY 50 MG-200 MG-25 MG TABLET: ORAL | 30 days supply | Qty: 30 | Fill #1

## 2023-06-09 NOTE — Unmapped (Signed)
[facility]ty and Home Delivery Pharmacy Refill Coordination Note    Specialty Medication(s) to be Shipped:   Infectious Disease: Biktarvy    Other medication(s) to be shipped: No additional medications requested for fill at this time     Shelley Beck, DOB: 1976/02/09  Phone: [phone] (home)       All above HIPAA information was verified with patient.     Was a Nurse, learning disability used for this call? No    Completed refill call assessment today to schedule patient's medication shipment from the [facility] Delivery Pharmacy  [phone]).  All relevant notes have been reviewed.     Specialty medication(s) and dose(s) confirmed: Regimen is correct and unchanged.   Changes to medications: Shelley Beck reports no changes at this time.  Changes to insurance: No  New side effects reported not previously addressed with a pharmacist or physician: None reported  Questions for the pharmacist: No    Confirmed patient received a Conservation officer, historic buildings and a Surveyor, mining with first shipment. The patient will receive a drug information handout for each medication shipped and additional FDA Medication Guides as required.       DISEASE/MEDICATION-SPECIFIC INFORMATION        N/A    SPECIALTY MEDICATION ADHERENCE     Medication Adherence    Patient reported X missed doses in the last month: 0  Specialty Medication: BIKTARVY 50-200-25 mg tablet (bictegrav-emtricit-tenofov ala)  Patient is on additional specialty medications: No  Patient is on more than two specialty medications: No  Any gaps in refill history greater than 2 weeks in the last 3 months: no  Demonstrates understanding of importance of adherence: yes  Informant: patient  Confirmed plan for next specialty medication refill: delivery by pharmacy  Refills needed for supportive medications: not needed          Refill Coordination    Has the Patients' Contact Information Changed: No  Is the Shipping Address Different: No         Were doses missed due to medication being on hold? No    BIKTARVY 50-200-25   mg: 4 days of medicine on hand       REFERRAL TO PHARMACIST     Referral to the pharmacist: Not needed      [facility]     Shipping address confirmed in Epic.       Delivery Scheduled: Yes, Expected medication delivery date: 06/13/23.     Medication will be delivered via Same Day Courier to the prescription address in Epic WAM.    Shelley Beck   [facility] Specialty and Home Delivery Pharmacy  Specialty Technician

## 2023-06-13 MED FILL — BIKTARVY 50 MG-200 MG-25 MG TABLET: ORAL | 30 days supply | Qty: 30 | Fill #2

## 2023-07-27 NOTE — Unmapped (Signed)
[facility] Specialty and Home Delivery Pharmacy Refill Coordination Note    Specialty Medication(s) to be Shipped:   Infectious Disease: Biktarvy    Other medication(s) to be shipped: No additional medications requested for fill at this time     Shelley Beck, DOB: 15-Oct-1975  Phone: [phone] (home)       All above HIPAA information was verified with patient's family member, spouse.     Was a Nurse, learning disability used for this call? No    Completed refill call assessment today to schedule patient's medication shipment from the [facility] and [facility] Delivery Pharmacy  [phone]).  All relevant notes have been reviewed.     Specialty medication(s) and dose(s) confirmed: Regimen is correct and unchanged.   Changes to medications: Ailsa reports no changes at this time.  Changes to insurance: No  New side effects reported not previously addressed with a pharmacist or physician: None reported  Questions for the pharmacist: No    Confirmed patient received a Conservation officer, historic buildings and a Surveyor, mining with first shipment. The patient will receive a drug information handout for each medication shipped and additional FDA Medication Guides as required.       DISEASE/MEDICATION-SPECIFIC INFORMATION        N/A    SPECIALTY MEDICATION ADHERENCE     Medication Adherence    Patient reported X missed doses in the last month: 0  Specialty Medication: BIKTARVY 50-200-25 mg tablet (bictegrav-emtricit-tenofov ala)  Patient is on additional specialty medications: No  Patient is on more than two specialty medications: No  Any gaps in refill history greater than 2 weeks in the last 3 months: no  Demonstrates understanding of importance of adherence: yes  Informant: patient  Confirmed plan for next specialty medication refill: delivery by pharmacy  Refills needed for supportive medications: not needed          Refill Coordination    Has the Patients' Contact Information Changed: No  Is the Shipping Address Different: No         Were doses missed due to medication being on hold? No    BIKTARVY 50-200-25   mg: 4 days of medicine on hand       REFERRAL TO PHARMACIST     Referral to the pharmacist: Not needed      [facility]     Shipping address confirmed in Epic.       Delivery Scheduled: Yes, Expected medication delivery date: 07/29/23.     Medication will be delivered via Next Day Courier to the prescription address in Epic WAM.    [name]   [facility] Specialty and Home Delivery Pharmacy  Specialty Technician

## 2023-07-28 MED FILL — BIKTARVY 50 MG-200 MG-25 MG TABLET: ORAL | 30 days supply | Qty: 30 | Fill #3

## 2023-08-23 DIAGNOSIS — B20 Human immunodeficiency virus [HIV] disease: Principal | ICD-10-CM

## 2023-08-23 MED ORDER — BIKTARVY 50 MG-200 MG-25 MG TABLET
ORAL_TABLET | Freq: Every day | ORAL | 11 refills | 30.00 days | Status: CP
Start: 2023-08-23 — End: ?
  Filled 2023-09-13: qty 30, 30d supply, fill #0

## 2023-09-06 NOTE — Unmapped (Signed)
 The [facility] has made a second and final attempt to reach this patient to refill the following medication:bictegrav-emtricit-tenofov ala: BIKTARVY 50-200-25 mg tablet.      We have left voicemails on the following phone numbers: [phone], have sent a MyChart message, and have sent a text message to the following phone numbers: [phone] .    Dates contacted: 08/23/2023 and 09/06/2023  Last scheduled delivery: 07/28/2023    The patient may be at risk of non-compliance with this medication. The patient should call the [facility] at [phone]  Option 4, then Option 4: Infectious Disease, Transplant to refill medication.    Delaine Lame   [facility] Specialty and Home Delivery Oncologist

## 2023-09-09 NOTE — Unmapped (Signed)
 [facility] Delivery Pharm[facility] Refill Coordination Note    Specialty Medication(s) to be Shipped:   Infectious Disease: Biktarvy    Other medication(s) to be shipped: No additional medications requested for fill at this time     Shelley Beck, DOB: June 29, 1975  Phone: [phone] (home)       All above HIPAA information was verified with patient's family member, spouse.     Was a Nurse, learning disability used for this call? No    Completed refill call assessment today to schedule patient's medication shipment from the [facility] Delivery Pharm[facility]  [phone]).  All relevant notes have been reviewed.     Specialty medication(s) and dose(s) confirmed: Regimen is correct and unchanged.   Changes to medications: Shelley Beck reports no changes at this time.  Changes to insurance: No  New side effects reported not previously addressed with a pharmacist or physician: None reported  Questions for the pharmacist: No    Confirmed patient received a Conservation officer, historic buildings and a Surveyor, mining with first shipment. The patient will receive a drug information handout for each medication shipped and additional FDA Medication Guides as required.       DISEASE/MEDICATION-SPECIFIC INFORMATION        N/A    SPECIALTY MEDICATION ADHERENCE     Medication Adherence    Patient reported X missed doses in the last month: 0  Specialty Medication: BIKTARVY 50-200-25 mg tablet (bictegrav-emtricit-tenofov ala)  Patient is on additional specialty medications: No              Were doses missed due to medication being on hold? No    Biktarvy 50-200-25 mg: 9 days of medicine on hand        REFERRAL TO PHARMACIST     Referral to the pharmacist: Not needed      [facility]     Shipping address confirmed in Epic.     Cost and Payment: Patient has a $0 copay, payment information is not required.    Delivery Scheduled: Yes, Expected medication delivery date: 09/14/23.     Medication will be delivered via Next Day Courier to the prescription address in Epic [place].    Shelley Beck   [facility] Delivery [facility]  Specialty Technician

## 2023-10-04 NOTE — Unmapped (Signed)
 [facility] Delivery Pharm[facility] Refill Coordination Note    Specialty Medication(s) to be Shipped:   Infectious Disease: Biktarvy     Other medication(s) to be shipped: No additional medications requested for fill at this time     Shelley Beck, DOB: 1976/01/21  Phone: [phone] (home)       All above HIPAA information was verified with patient.     Was a Nurse, learning disability used for this call? No    Completed refill call assessment today to schedule patient's medication shipment from the [facility] Delivery Pharm[facility]  [phone]).  All relevant notes have been reviewed.     Specialty medication(s) and dose(s) confirmed: Regimen is correct and unchanged.   Changes to medications: Shelley Beck reports no changes at this time.  Changes to insurance: No  New side effects reported not previously addressed with a pharmacist or physician: None reported  Questions for the pharmacist: No    Confirmed patient received a Conservation officer, historic buildings and a Surveyor, mining with first shipment. The patient will receive a drug information handout for each medication shipped and additional FDA Medication Guides as required.       DISEASE/MEDICATION-SPECIFIC INFORMATION        N/A    SPECIALTY MEDICATION ADHERENCE     Medication Adherence    Patient reported X missed doses in the last month: 0  Specialty Medication: BIKTARVY  50-200-25 mg  Patient is on additional specialty medications: No  Informant: patient     Were doses missed due to medication being on hold? No    Biktarvy  50-200-25 mg: 12 days of medicine on hand       REFERRAL TO PHARMACIST     Referral to the pharmacist: Not needed      [facility]     Shipping address confirmed in Epic.     Cost and Payment: Patient has a $0 copay, payment information is not required.    Delivery Scheduled: Yes, Expected medication delivery date: 10/13/23.     Medication will be delivered via Next Day Courier to the prescription address in Epic [place].    Shelley Beck   [facility]  Specialty Technician

## 2023-10-12 MED FILL — BIKTARVY 50 MG-200 MG-25 MG TABLET: ORAL | 30 days supply | Qty: 30 | Fill #1

## 2023-11-10 NOTE — Unmapped (Signed)
 [name] and Home Delivery Pharmacy Clinical Assessment & Refill Coordination Note    Shelley Beck, DOB: Dec 15, 1975  Phone: [phone] (home)     All above HIPAA information was verified with patient.     Was a Nurse, learning disability used for this call? No    Specialty Medication(s):   Infectious Disease: Biktarvy     Current Outpatient Medications   Medication Sig Dispense Refill    ascorbic acid, vitamin C, (VITAMIN C) 1000 MG tablet Take 2 tablets (2,000 mg total) by mouth daily.      bictegrav-emtricit-tenofov ala (BIKTARVY) 50-200-25 mg tablet Take 1 tablet by mouth daily. 30 tablet 11    calcium carbonate (OS-CAL) 1,500 mg (600 mg elem calcium) tablet Take 1 tablet (600 mg elem calcium total) by mouth daily.      cyclobenzaprine (FLEXERIL) 10 MG tablet TAKE 1 TABLET BY MOUTH TWICE DAILY AS NEEDED FOR MUSCLE SPASM      escitalopram oxalate (LEXAPRO) 20 MG tablet Take 1 tablet (20 mg total) by mouth daily. 90 tablet 1    hydrOXYzine (ATARAX) 25 MG tablet TAKE 1 TO 2 TABLETS BY MOUTH THREE TIMES DAILY AS NEEDED FOR ANXIETY      ibuprofen (MOTRIN) 800 MG tablet TAKE 1 TABLET BY MOUTH THREE TIMES DAILY FOR 10 DAYS      metFORMIN (GLUCOPHAGE-XR) 750 MG 24 hr tablet Take 1 tablet (750 mg total) by mouth daily before breakfast. 90 tablet 0    naltrexone HCl (NALTREXONE ORAL) Take 0.1 mg by mouth.      ondansetron (ZOFRAN-ODT) 4 MG disintegrating tablet DISSOLVE ONE TABLET ON TONGUE EVERY 8 HRS AS NEEDED FOR NAUSEA       Current Facility-Administered Medications   Medication Dose Route Frequency Provider Last Rate Last Admin    povidone-iodine 10 % swab 1 application.  1 application. Each Nare Once Seena Dadds, MD            Changes to medications: Lynden reports no changes at this time.    Medication list has been reviewed and updated in Epic: Yes    Allergies   Allergen Reactions    Tramadol Other (See Comments)     Severe headache       Changes to allergies: No    Allergies have been reviewed and updated in Epic: Yes    SPECIALTY MEDICATION ADHERENCE     Biktarvy 50-200-25 mg: unable to assess days of medicine on hand - pt driving      Medication Adherence    Patient reported X missed doses in the last month: 0  Specialty Medication: Biktarvy 50-200-25mg   Patient is on additional specialty medications: No  Informant: patient          Specialty medication(s) dose(s) confirmed: Regimen is correct and unchanged.     Are there any concerns with adherence? No    Adherence counseling provided? Not needed    CLINICAL MANAGEMENT AND INTERVENTION      Clinical Benefit Assessment:    Do you feel the medicine is effective or helping your condition? Patient declined to answer    Clinical Benefit counseling provided? Labs from 10/2022 show evidence of clinical benefit    Adverse Effects Assessment:    Are you experiencing any side effects? No    Are you experiencing difficulty administering your medicine? No    Quality of Life Assessment:    Quality of Life    Rheumatology  Oncology  Dermatology  Cystic Fibrosis  How many days over the past month did your HIV  keep you from your normal activities? For example, brushing your teeth or getting up in the morning. Patient declined to answer    Have you discussed this with your provider? Not needed    Acute Infection Status:    Acute infections noted within Epic:  No active infections    Patient reported infection: None    Therapy Appropriateness:    Is therapy appropriate based on current medication list, adverse reactions, adherence, clinical benefit and progress toward achieving therapeutic goals? Yes, therapy is appropriate and should be continued     Clinical Intervention:    Was an intervention completed as part of this clinical assessment? No    DISEASE/MEDICATION-SPECIFIC INFORMATION      N/A    HIV: Not Applicable    PATIENT SPECIFIC NEEDS     Does the patient have any physical, cognitive, or cultural barriers? No    Is the patient high risk? No    Does the patient require physician intervention or other additional services (i.e., nutrition, smoking cessation, social work)? No    Does the patient have an additional or emergency contact listed in their chart? Yes    SOCIAL DETERMINANTS OF HEALTH     At the [facility], we have learned that life circumstances - like trouble affording food, housing, utilities, or transportation can affect the health of many of our patients.   That is why we wanted to ask: are you currently experiencing any life circumstances that are negatively impacting your health and/or quality of life? Patient declined to answer    Social Drivers of Health     Food Insecurity: Not on file   Tobacco Use: Low Risk  (11/02/2022)    Patient History     Smoking Tobacco Use: Never     Smokeless Tobacco Use: Never     Passive Exposure: Not on file   Transportation Needs: Not on file   Alcohol Use: Not At Risk (06/21/2019)    Received from [facility]    AUDIT-C     Frequency of Alcohol Consumption: Never     Average Number of Drinks: Not on file     Frequency of Binge Drinking: Not on file   Housing: Not on file   Physical Activity: Not on file   Utilities: Not on file   Stress: Not on file   Interpersonal Safety: Not on file   Substance Use: Not on file (04/26/2023)   Intimate Partner Violence: Not on file   Social Connections: Not on file   Financial Resource Strain: Not on file   Health Literacy: Low Risk  (10/27/2021)    Health Literacy     : Never   Internet Connectivity: Not on file       Would you be willing to receive help with any of the needs that you have identified today? Not applicable       SHIPPING     Specialty Medication(s) to be Shipped:   Infectious Disease: Biktarvy    Other medication(s) to be shipped: No additional medications requested for fill at this time     Changes to insurance: No    Cost and Payment: Patient has a $0 copay, payment information is not required.    Delivery Scheduled: Yes, Expected medication delivery date: 11/12/23.     Medication will be delivered via Next Day Courier to the confirmed prescription address in  Epic [place].    The patient will receive a drug information handout for each medication shipped and additional FDA Medication Guides as required.  Verified that patient has previously received a Conservation officer, historic buildings and a Surveyor, mining.    The patient or caregiver noted above participated in the development of this care plan and knows that they can request review of or adjustments to the care plan at any time.      All of the patient's questions and concerns have been addressed.    Riesa Chary, PharmD   [facility] Delivery Pharm[facility] Specialty Pharmacist

## 2023-11-11 MED FILL — BIKTARVY 50 MG-200 MG-25 MG TABLET: ORAL | 30 days supply | Qty: 30 | Fill #2

## 2023-11-28 NOTE — Unmapped (Signed)
 [facility] Delivery Pharm[facility] Refill Coordination Note    Specialty Medication(s) to be Shipped:   Infectious Disease: Biktarvy    Other medication(s) to be shipped: No additional medications requested for fill at this time     Shelley Beck, DOB: 08-Aug-1975  Phone: [phone] (home)       All above HIPAA information was verified with patient.     Was a Nurse, learning disability used for this call? No    Completed refill call assessment today to schedule patient's medication shipment from the [facility] and [facility] Delivery Pharmacy  [phone]).  All relevant notes have been reviewed.     Specialty medication(s) and dose(s) confirmed: Regimen is correct and unchanged.   Changes to medications: Kim reports no changes at this time.  Changes to insurance: No  New side effects reported not previously addressed with a pharmacist or physician: None reported  Questions for the pharmacist: No    Confirmed patient received a Conservation officer, historic buildings and a Surveyor, mining with first shipment. The patient will receive a drug information handout for each medication shipped and additional FDA Medication Guides as required.       DISEASE/MEDICATION-SPECIFIC INFORMATION        N/A    SPECIALTY MEDICATION ADHERENCE     Medication Adherence    Patient reported X missed doses in the last month: 0  Specialty Medication: BIKTARVY 50-200-25 mg tablet (bictegrav-emtricit-tenofov ala)  Patient is on additional specialty medications: No              Were doses missed due to medication being on hold? No     BIKTARVY 50-200-25 mg tablet (bictegrav-emtricit-tenofov ala): 7 days of medicine on hand       REFERRAL TO PHARMACIST     Referral to the pharmacist: Not needed      [facility]     Shipping address confirmed in Epic.     Cost and Payment: Patient has a $0 copay, payment information is not required.    Delivery Scheduled: Yes, Expected medication delivery date: 12/06/2023.     Medication will be delivered via Next Day Courier to the prescription address in Epic [place].    [name]   [facility] Delivery Pharm[facility]  Specialty Technician

## 2023-12-05 MED FILL — BIKTARVY 50 MG-200 MG-25 MG TABLET: ORAL | 30 days supply | Qty: 30 | Fill #3

## 2023-12-06 DIAGNOSIS — Z83719 Family history of polyps in the colon: Principal | ICD-10-CM

## 2023-12-06 DIAGNOSIS — Z1211 Encounter for screening for malignant neoplasm of colon: Principal | ICD-10-CM

## 2023-12-06 MED ORDER — SUFLAVE 178.7 GRAM-7.3 GRAM-0.5 GRAM ORAL SOLUTION
ORAL | 0 refills | 0.00000 days | Status: CP
Start: 2023-12-06 — End: ?

## 2023-12-06 NOTE — Unmapped (Signed)
 Name: Shelley Beck  Date of Birth: 1975/06/25    Subjective:  History of Present Illness  Shelley Beck is a 48 year old female with a history of right knee surgery and arthritis who presents with worsening right knee pain.    She has been experiencing right knee pain that has worsened over the past month, becoming more severe two weeks ago. She received a corticosteroid injection at [facility] two weeks ago which did not provide relief, and was seen again earlier this week in their clinic due to excruciating pain.    She is currently taking 4000 mg of Tylenol daily, 800 mg of ibuprofen every six hours, previously tried meloxicam, but  ineffective. She also started a Medrol Dosepak and gabapentin for pain management earlier this week per [facility]. Despite these treatments, she continues to experience significant pain, describing it as 'throbbing' and affecting her ability to work.    The pain is primarily located at the medial femoral condyle and radiates across the knee. No significant numbness, tingling, or shooting pain down the leg. She has not been able to rest the knee due to work obligations and reports difficulty with mobility.    She notes that this pain is different from her previous experiences with her history of right knee surgery and arthritis.    In her family history, her grandmother has diabetes. She occasionally vapes nicotine. She has metal rods in her back from surgery in the 1980s, which have not caused issues with previous MRIs.    I have reviewed past medical, surgical, medication, allergy, social and family histories today and updated them in Epic where appropriate.    ROS:   Review of systems negative unless otherwise noted as per HPI.    Social History[1]    Current Medications[2]     Objective:       12/08/23 0813   BP: 148/93   Pulse: 82   Weight: 98.4 kg (217 lb)   Height: 160 cm (5' 3)      Body mass index is 38.44 kg/m??.    Physical Exam:    General/Constitutional: No apparent distress: well-nourished and well developed.    Respiratory: Non-labored breathing     Vascular: No edema, swelling or tenderness, except as noted in detailed exam.    Integumentary: No impressive skin lesions present, except as noted in detailed exam.    Musculoskeletal: Normal, except as noted in detailed exam and in HPI.    Physical Exam  Antalgic gait    Right Knee Exam:      Inspection:          Swelling: nl          Skin changes: none          Anterior: nl          Posterior: nl      Palpation:          Patellar Tendon: nl          Patella: nl          Quad Tendon: nl          Medial Joint Line: TTP          Lateral Joint Line: nl          Popliteal fossa: nl          Pes Anserine area: nl          Anterior Tibial Tubercle: nl  Medial Femoral Condyle: TTP          Lateral Femoral Condyle: nl      ROM          No pain with passive ER/IR of the hip          Flexion: 115          Extension: 0 - pain to medial knee at full extension      Strength:          Flexion: nl          Extension: nl      Ligament Testing:          Anterior Drawer (ACL): nl          Lachman's (ACL): nl          Posterior Drawer (PCL): nl          Posterior Sag (PCL): nl          Valgus Stress (MCL): nl          Varus Stress (LCL): nl      Meniscus Testing:          McMurray's: Positive      Patella Testing:          Patellar Grind: nl          Patellar Tracking: nl          Patellar Mobility: nl    lower extremity motor and neuro exam:  gastroc-soleus complex/tibialis anterior/ and extensor hallucis longus motor intact; sensation intact to light touch saphenous/sural/ superficial peroneal/ deep peroneal/ and tibial nerve distribution, warm and well perfused    No pain with compression of the calf.    DP pulse palpable    XRAY: XR Knee 3 Views Right  Result Date: 12/08/2023  Right knee with subchondral sclerosis to the medial joint line, mild spurring to superior aspect of patella.     Left knee with subchondral sclerosis to medial and lateral joint line on AP, joint line osteophytes noted.         Assessment & Plan  Right knee pain  Chronic right knee pain with severe exacerbation, unresponsive to recent corticosteroid injection. Does have some evidence of degenerative changes on XR, but no improvement with NSAIDs or steroid injection. Differential includes soft tissue, meniscal, or ligamentous injury. MRI required for diagnosis confirmation.  - Order MRI.  - Provide crutches for ambulation support.  - Recommend follow-up 3-5 days after MRI.    No follow-ups on file.    Documentation of this office visit was completed with the aid of Dragon Medical voice recognition software.  Transcription may contain typographical errors.     Debby Vannie Budge, [place]  12/08/2023   10:42 AM          [1]   Social History  Socioeconomic History    Marital status: Divorced     Spouse name: None    Number of children: None    Years of education: None    Highest education level: None   Tobacco Use    Smoking status: Never    Smokeless tobacco: Never    Tobacco comments:     Vape occasionally    Vaping Use    Vaping status: Every Day    Substances: Nicotine, Flavoring   Substance and Sexual Activity    Alcohol use: Yes     Alcohol/week: 1.0 standard drink of alcohol     Types: 1 Glasses of wine per week  Comment: socially    Drug use: Never    Sexual activity: Yes     Partners: Male   [2]   Current Outpatient Medications:     ascorbic acid, vitamin C, (VITAMIN C) 1000 MG tablet, Take 2 tablets (2,000 mg total) by mouth daily., Disp: , Rfl:     bictegrav-emtricit-tenofov ala (BIKTARVY) 50-200-25 mg tablet, Take 1 tablet by mouth daily., Disp: 30 tablet, Rfl: 11    calcium carbonate (OS-CAL) 1,500 mg (600 mg elem calcium) tablet, Take 1 tablet (600 mg elem calcium total) by mouth daily., Disp: , Rfl:     cyclobenzaprine (FLEXERIL) 10 MG tablet, TAKE 1 TABLET BY MOUTH TWICE DAILY AS NEEDED FOR MUSCLE SPASM, Disp: , Rfl: escitalopram oxalate (LEXAPRO) 20 MG tablet, Take 1 tablet (20 mg total) by mouth daily., Disp: 90 tablet, Rfl: 1    gabapentin (NEURONTIN) 100 MG capsule, , Disp: , Rfl:     hydrOXYzine (ATARAX) 25 MG tablet, TAKE 1 TO 2 TABLETS BY MOUTH THREE TIMES DAILY AS NEEDED FOR ANXIETY, Disp: , Rfl:     ibuprofen (MOTRIN) 800 MG tablet, TAKE 1 TABLET BY MOUTH THREE TIMES DAILY FOR 10 DAYS, Disp: , Rfl:     metFORMIN (GLUCOPHAGE-XR) 750 MG 24 hr tablet, Take 1 tablet (750 mg total) by mouth daily before breakfast., Disp: 90 tablet, Rfl: 0    methylPREDNISolone (MEDROL DOSEPACK) 4 mg tablet, , Disp: , Rfl:     naltrexone HCl (NALTREXONE ORAL), Take 0.1 mg by mouth., Disp: , Rfl:     ondansetron (ZOFRAN-ODT) 4 MG disintegrating tablet, DISSOLVE ONE TABLET ON TONGUE EVERY 8 HRS AS NEEDED FOR NAUSEA, Disp: , Rfl:     peg 3350-sod sulf,chlr-pot-mag (SUFLAVE) 178.7-7.3-0.5 gram SolR, Take 3,550 Containers by mouth Take as directed., Disp: 1 each, Rfl: 0    tirzepatide 10 mg/0.2 mL Syrg, Inject under the skin., Disp: , Rfl:     Current Facility-Administered Medications:     povidone-iodine 10 % swab 1 application., 1 application., Each Nare, Once, [place], Cordella Feil, MD

## 2023-12-06 NOTE — Unmapped (Signed)
 Height: 5' 3  Weight 219 LBS  BMI: 38.8      Pt Scheduled SCHEDULED 01/17/2024 @ 11:00AM COLON W/PK AT REC-M   Procedure COLON   Diagnosis SCREENING COLON, FAM HX OF COLON POLYPS    Prep [facility]    [facility] Provider    Packet sent via RHONA SAILOR [facility] Schedule Updated?   N PAT   N Diabetic Info needed   N Latex Allergy   N Malignant Hyperthermia   N Clearance   Y [facility] PROCESS EXPLAINED TO PATIENT   N Have you tested positive in the last 21 days for COVID-19?   N Prebariatric?  Neck Circumference:  Surgeon: NG SHARP ED HERRLICH  Surgeon Consultation Date:      MEDS Current Outpatient Medications   Medication Instructions    ascorbic acid (vitamin C) (VITAMIN C) 2,000 mg, Oral, Daily (standard)    bictegrav-emtricit-tenofov ala (BIKTARVY) 50-200-25 mg tablet 1 tablet, Oral, Daily (standard)    calcium carbonate (OS-CAL) 1,500 mg (600 mg elem calcium) tablet 1 tablet, Oral, Daily (standard)    cyclobenzaprine (FLEXERIL) 10 MG tablet TAKE 1 TABLET BY MOUTH TWICE DAILY AS NEEDED FOR MUSCLE SPASM    escitalopram oxalate (LEXAPRO) 20 mg, Oral, Daily (standard)    gabapentin (NEURONTIN) 100 MG capsule     hydrOXYzine (ATARAX) 25 MG tablet TAKE 1 TO 2 TABLETS BY MOUTH THREE TIMES DAILY AS NEEDED FOR ANXIETY    ibuprofen (MOTRIN) 800 MG tablet TAKE 1 TABLET BY MOUTH THREE TIMES DAILY FOR 10 DAYS    metFORMIN (GLUCOPHAGE-XR) 750 mg, Oral, Daily before breakfast    methylPREDNISolone (MEDROL DOSEPACK) 4 mg tablet     naltrexone HCl (NALTREXONE ORAL) 0.1 mg, Oral    ondansetron (ZOFRAN-ODT) 4 MG disintegrating tablet DISSOLVE ONE TABLET ON TONGUE EVERY 8 HRS AS NEEDED FOR NAUSEA      N Are you diabetic?   Y GLP1 Info needed? If so, which GLP are they taking?     N Do you take Phentermine (Lomaira, Adipex-P)?   ALLERGIES Tramadol   N Have you had a colonoscopy before? If yes, who did it and when was it done?      N Do you have any personal history of colon cancer, familial adenomatous polyposis?    Y Do you have any family hx of colon polyps or colon cancer? If yes, which and what family member? What age was diagnosis or polyps found? MOTHER-POLYPS, MATERNAL GRANDMOTHER-POLYPS    N Do you have any current issues with abdominal pain, constipation, diarrhea or blood in your stool?    N Are you allergic to Latex? If yes, what is the reaction?        N Do you have a history of myasthenia gravis?     N Do you have a tracheostomy tube?     N Do you have sleep apnea? If yes, ask severity & setting of CPAP machine.    N Have you used supplemental oxygen at home within the past year? If yes, why?    N IS PATIENT ON DIALYSIS?    N Have you had any problems with anesthesia before? If so, please explain.    N Any history of difficult IV Access (ie, staff needing to use a Vein Finder or ultrasound)?    N Do you have a personal history of malignant hyperthermia?    N Have you ever been told you are difficult to intubate?    N  Are there any mobility restrictions (able to move independently to the procedure table and maneuver on side independently?    N Do you have an Implantable Cardiac Defibrillator (ICD)?    N Do  you reside in a facility or setting where you receive assistance with daily activities (such as a group home, assisted living, or another type of care setting)?    N Are you anemic or taking any iron supplements?    N Do you have any lung problems or SOB upon exertion?    N Does patient have cardiac risk factors that require a chart review?  Do you have a cardiologist and what is their name?  NO     Stents  Have you had a stent in the last year? no  Have you had a stent placed more than a year ago? no    Anticoagulation  Do you take any of the following medications: Eliquis (apixaban), Xarelto (rivaroxaban), Coumadin (warfarin), Lovenox, Pradaxa (dabigatran), Plavix (clopidogrel)?   no     Chest Pain  Do you have any active chest pain? no    Heart Attack  Have you had a heart attack in the past 30 days? no    CHF  Do you have heart failure or cardiomyopathy? no    Arrhythmias  Do you have palpitations or arrhythmias (AV block, SVT, ventricular arrhythmias)?  no    Pulmonary HTN  Do you have pulmonary hypertension (high blood pressure in the lungs)? no    Heart Valves  Do you have any history of any heart valve disease/replacement? no    Procedures  In the last year, have you undergone the following procedures: CABG, bioprosthetic valve placement, Watchman, ICD, Holter Monitor, Ziopatch placement? no    Aneurysm  Do you have any history of an aneurysm? no     N Have you ever had a TIA, stroke or seizure in the last year? Do you take any blood thinners related to this?    N Do you have liver or kidney disease?    N Are there any guardianship/power attorney/ or legal items that may need to be addressed at the time of the procedure?     N Any difficulty swallowing pills?   N Any history of poor prep?

## 2023-12-08 ENCOUNTER — Inpatient Hospital Stay: Admit: 2023-12-08 | Discharge: 2023-12-09 | Payer: PRIVATE HEALTH INSURANCE

## 2023-12-08 DIAGNOSIS — M25561 Pain in right knee: Principal | ICD-10-CM

## 2023-12-08 DIAGNOSIS — M170 Bilateral primary osteoarthritis of knee: Principal | ICD-10-CM

## 2023-12-08 NOTE — Unmapped (Signed)
 Right knee pain  Chronic right knee pain with severe exacerbation, unresponsive to recent corticosteroid injection. Does have some evidence of degenerative changes on XR, but no improvement with NSAIDs or steroid injection. Differential includes soft tissue, meniscal, or ligamentous injury. MRI required for diagnosis confirmation.  - Order MRI.  - Provide crutches for ambulation support.  - Recommend follow-up 3-5 days after MRI.

## 2024-01-03 NOTE — Unmapped (Signed)
 [facility] Specialty and Home Delivery Pharmacy Refill Coordination Note    Specialty Medication(s) to be Shipped:   Infectious Disease: Biktarvy     Other medication(s) to be shipped: No additional medications requested for fill at this time     Shelley Beck, DOB: 09-21-1975  Phone: [phone] (home)       All above HIPAA information was verified with patient's family member, Husband.     Was a Nurse, learning disability used for this call? No    Completed refill call assessment today to schedule patient's medication shipment from the [facility] and Home Delivery Pharmacy  [phone]).  All relevant notes have been reviewed.     Specialty medication(s) and dose(s) confirmed: Regimen is correct and unchanged.   Changes to medications: Shelley Beck reports no changes at this time.  Changes to insurance: No  New side effects reported not previously addressed with a pharmacist or physician: None reported  Questions for the pharmacist: No    Confirmed patient received a Conservation officer, historic buildings and a Surveyor, mining with first shipment. The patient will receive a drug information handout for each medication shipped and additional FDA Medication Guides as required.       DISEASE/MEDICATION-SPECIFIC INFORMATION        N/A    SPECIALTY MEDICATION ADHERENCE     Medication Adherence    Patient reported X missed doses in the last month: 0  Specialty Medication: BIKTARVY  50-200-25 mg tablet (bictegrav-emtricit-tenofov ala)  Patient is on additional specialty medications: No              Were doses missed due to medication being on hold? No     BIKTARVY  50-200-25 mg tablet (bictegrav-emtricit-tenofov ala): 7 days of medicine on hand       REFERRAL TO PHARMACIST     Referral to the pharmacist: Not needed      [facility]     Shipping address confirmed in Epic.     Cost and Payment: Patient has a $0 copay, payment information is not required.    Delivery Scheduled: Yes, Expected medication delivery date: 01/09/2024.     Medication will be delivered via Same Day Courier to the prescription address in Epic WAM.    Shelley Beck   [facility] and Home Delivery Pharm[facility]  Specialty Technician

## 2024-01-09 MED FILL — BIKTARVY 50 MG-200 MG-25 MG TABLET: ORAL | 30 days supply | Qty: 30 | Fill #4

## 2024-01-10 MED ORDER — SUTAB 1.479-0.188-0.225 GRAM TABLET
ORAL_TABLET | ORAL | 0 refills | 0.00000 days | Status: CP
Start: 2024-01-10 — End: ?

## 2024-01-10 NOTE — Unmapped (Signed)
 I called Ms Shelley Beck concerning colonoscopy on 01/17/2024. Patient identity verified with two patient identifiers. Patient aware to hold mounjaro  at least 7 days prior.   - Patient states she needs sutab . (RX Sent [facility]).   - Sutab  instructions sent to mychart.

## 2024-02-10 NOTE — Unmapped (Signed)
 The [facility] has made a second and final attempt to reach this patient to refill the following medication:BIKTARVY  50-200-25 mg tablet (bictegrav-emtricit-tenofov ala).      We have left voicemails on the following phone numbers: [phone], have sent a MyChart message, and have sent a text message to the following phone numbers: [phone].    Dates contacted: 01/31/2024 and 02/10/2024  Last scheduled delivery: 01/09/2024    The patient may be at risk of non-compliance with this medication. The patient should call the [facility] at [phone]  Option 4, then Option 4: Infectious Disease, Transplant to refill medication.    Shelley Beck   [facility] Special[facility] and Home Delivery Oncologist

## 2024-02-13 NOTE — Unmapped (Signed)
 I have called and left message requesting pt return my call and provided my direct extension #

## 2024-02-21 NOTE — Unmapped (Signed)
 Pt has not been seen by this ID office since 10/2022

## 2024-03-23 ENCOUNTER — Encounter
Admit: 2024-03-23 | Discharge: 2024-03-23 | Payer: PRIVATE HEALTH INSURANCE | Attending: Physician Assistant | Primary: Physician Assistant

## 2024-03-23 DIAGNOSIS — R350 Frequency of micturition: Principal | ICD-10-CM

## 2024-03-23 DIAGNOSIS — H0289 Other specified disorders of eyelid: Principal | ICD-10-CM

## 2024-03-23 MED ORDER — NITROFURANTOIN MONOHYDRATE/MACROCRYSTALS 100 MG CAPSULE
ORAL_CAPSULE | Freq: Two times a day (BID) | ORAL | 0 refills | 7.00000 days | Status: CP
Start: 2024-03-23 — End: 2024-03-30

## 2024-03-23 MED ORDER — ERYTHROMYCIN 5 MG/GRAM (0.5 %) EYE OINTMENT
Freq: Three times a day (TID) | OPHTHALMIC | 0 refills | 0.00000 days | Status: CP
Start: 2024-03-23 — End: ?

## 2024-03-23 NOTE — Unmapped (Signed)
 The urine culture is pending.  The urine culture will reveal if there is any pathogenic bacteria that are known to cause a urinary tract infection.  Adjustments to your treatment plan will be made depending on the urine culture results.  Care instructions for urinary tract infections:    OTC supplement: Vaccinium macrocarpon (cranberry, not cranberry juice cocktail) may help to prevent and treat UTIs by inhibiting bacterial adherence to the bladder epithelium. It may decrease the number of symptomatic UTIs over a 1-year period.  You can buy this medication over-the-counter.    You should clean perineum wiping front to back.    You should empty Bladder before, after intercourse.    Increase hydration to >1.5L daily to help flush your bladder.    Take Phenazopyridine 100-200mg  TID.  This medication can decrease pain with urination.  Can turn urine orange. Don???t use >2 days.     You may also try D-mannose which helps to treat and prevent UTI  For treating an active UTI: 1.5 grams twice daily for 3 days, and then once daily for 10 days; or 1 gram three times daily for 14 days  Avoid food that can irritate the bladder.  Caffeine= the acids can irritate the bladder  Carbonated drinks like soda, selzer water, sparkling water can irritate sensitive bladders.   Chocolate= has caffeine and acid  Citrus= oranges, grapefruits, clementines, lemons, limes are all acidic and can irritate the bladder.  Pineapple  Tomatoes= is acidic  alcohol  Spicy food  Artificial sweeteners   Raw onions  Artificial flavors, preservatives and additives like MSG and benzyl alcohol can irritate the bladder. Found in processed foods  =========================================================    To use the antibiotic eye ointment, follow these steps:  Wash your hands thoroughly with soap and water.  Use a mirror or have someone else apply the ointment.  Avoid touching the tip of the tube against your eye or anything else. The ointment must be kept clean.  Tilt your head forward slightly.  Holding the tube between your thumb and index finger, place the tube as near as possible to your eyelid without touching it.  Brace the remaining fingers of that hand against your cheek or nose.  With the index finger of your other hand, pull the lower lid of your eye down to form a pocket.  Place a small amount of ointment into the pocket made by the lower lid and the eye. A 1-centimeter (about 1/2-inch) strip of ointment usually is enough unless otherwise directed by your doctor.  Look downward, then gently close your eyes and keep them closed for 1 to 2 minutes to allow the medication to be absorbed.  Replace and tighten the cap right away.  Wipe off any excess ointment from your eyelids and lashes with a clean tissue. Do not rub your eyes, even if your vision is blurry. Wash your hands again.  Use ophthalmic erythromycin until you finish the prescription, even if you feel better. If you stop using ophthalmic erythromycin too soon, your infection may not be completely cured and the bacteria may become resistant to antibiotics.    A warm compress is the most effective way to treat a stye. The warmth helps bring the pus to the surface, dissolving it so the stye can drain naturally.  Wet a clean washcloth with warm water. Make sure the water isn???t too hot. Wring the cloth, so it???s damp but not dripping. Then gently place it over your eye for about  10-15 minutes. Don???t squeeze or try to puncture the stye.  To keep the washcloth warm, reheat it every 30 seconds by dipping it in warm water and wringing it. Repeat the compress about four times daily for optimal results.  You can also use a warm tea bag. Black tea works best because it helps reduce swelling and has antibacterial properties.  Add boiled water to a mug, then drop a tea bag as if you were making tea. Let the tea steep for about one minute. Wait until the tea bag cools enough to place over your eye, then keep it on your eye for about 5-10 minutes. Warm it up every 30 seconds by placing it in warm water and wringing it. Use a separate tea bag for each eye.

## 2024-03-23 NOTE — Unmapped (Signed)
 Assessment/Plan:      Shelley Beck was seen today for urinary frequency.    Diagnoses and all orders for this visit:    1.Urinary frequency  Patient with acute complaint of dysuria, urinary urgency, urinary frequency.  On exam there is suprapubic tenderness.  No CVA tenderness.  Vital signs are stable and reassuring.  Patient is afebrile and in no acute distress. Preliminary U/A revealed: We will treat empirically with .  Provided list of treatment options to improve symptoms.  Urine culture is pending.  Patient will continue to monitor symptoms for the next few days.  Adjustments to treatment plan will be made depending on urine culture results.  Patient will follow-up with PCP if there is no improvement over the next week.     -     POCT Urinalysis Automated  -     nitrofurantoin, macrocrystal-monohydrate, (MACROBID) 100 MG capsule; Take 1 capsule (100 mg total) by mouth two (2) times a day for 7 days.  -     Urine culture    2.Irritation of eyelid  There is a well-circumscribed nodule on exam.  There is no tenderness.  No surrounding erythema.  Patient perform warm compresses for now.  If the lesion grows in size and becomes painful with surrounding erythema she will begin using the erythromycin ointment.    -     erythromycin (ROMYCIN) 5 mg/gram (0.5 %) ophthalmic ointment; Administer into the left eye Three (3) times a day. Use for 7 days        PHQ-2 Score: 0    PHQ-9 Score:      Edinburgh Score:      Screening complete, no depression identified / no further action needed today    Follow-up as Needed       Subjective:      Patient ID: Shelley Beck is a 48 y.o. female who  is being seen for   Chief Complaint   Patient presents with    Urinary Frequency     Patient reports urinary frequency that began this morning. She denies any accompanying symptoms.        HPI:     HPI  Patient is a 48 year old female who presents to clinic for evaluation of urinary urgency, urinary frequency.  The symptoms began this morning.  Despite frequent trips to the bathroom she continues to experience the urge to void.  She has experienced pressure in the lower abdominal region.  She denies abdominal pain, hematuria, flank pain, fever, chills, nausea, vomiting.  She has been diagnosed with acute cystitis in the past.  Last episode was approximately 6 to 8 months ago.  Current symptoms are similar.    Patient feels like she may have a stye forming.  She reports there is irritation to the left upper eyelid.  She denies pain, purulent drainage, swelling, photophobia, photopsia, blurry vision.    Past Medical/Surgical and Social History:     Past Medical History[1]  Past Surgical History[2]       Allergies:     Tramadol     Current Medications:     Current Medications[3]    I have reviewed and (if needed) updated the patient's problem list, medications, allergies, past medical and surgical history, preventive services, and social and family history.  ROS:     See HPI    Vital Signs:     Wt Readings from Last 3 Encounters:   03/23/24 91.9 kg (202 lb 9.6 oz)   12/08/23  98.4 kg (217 lb)   11/02/22 (!) 105.2 kg (232 lb)     Temp Readings from Last 3 Encounters:   03/23/24 36.7 ??C (98 ??F) (Temporal)   11/02/22 36.1 ??C (96.9 ??F) (Temporal)   03/02/22 36.6 ??C (97.8 ??F) (Oral)     BP Readings from Last 3 Encounters:   03/23/24 135/84   12/08/23 148/93   11/02/22 122/81     Pulse Readings from Last 3 Encounters:   03/23/24 85   12/08/23 82   11/02/22 77     Body mass index is 35.89 kg/m??.    Objective:      Physical Exam  Vitals and nursing note reviewed.   Constitutional:       Appearance: Normal appearance.   HENT:      Head: Normocephalic and atraumatic.   Eyes:      General: Lids are normal. Vision grossly intact. Gaze aligned appropriately.         Right eye: No foreign body, discharge or hordeolum.         Left eye: No foreign body, discharge or hordeolum.      Extraocular Movements:      Right eye: Normal extraocular motion and no nystagmus. Left eye: Normal extraocular motion and no nystagmus.      Conjunctiva/sclera:      Right eye: Right conjunctiva is not injected. No chemosis, exudate or hemorrhage.     Left eye: Left conjunctiva is not injected. No chemosis, exudate or hemorrhage.    Pulmonary:      Effort: Pulmonary effort is normal.      Breath sounds: Normal breath sounds.   Abdominal:      General: Bowel sounds are normal. There is no distension.      Palpations: Abdomen is soft.      Tenderness: There is no abdominal tenderness. There is no right CVA tenderness, left CVA tenderness, guarding or rebound.   Skin:     General: Skin is warm.   Neurological:      General: No focal deficit present.      Mental Status: She is alert.   Psychiatric:         Mood and Affect: Mood normal.           Labs:     Results for orders placed or performed in visit on 03/23/24   POCT Urinalysis Automated   Result Value Ref Range    Color, UA Yellow     Clarity, UA Clear     Glucose, UA Negative Negative    Bilirubin, UA Negative Negative    Ketones, POC Negative Negative    Spec Grav, UA 1.015 1.005 - 1.030    Blood, UA Trace-intact (A) Negative    pH, UA 6.5 5.0 - 9.0    Protein, UA Negative Negative    Urobilinogen, UA 0.2 E.U./dL Negative (0.2 mg/dL)    Nitrite, UA Negative Negative    Leukocytes, UA Trace (A) Negative    UA Strip Lot Number [id number]     UA Strip Lot Expiration 01/18/25     Serial Number [id number]          Follow-up:   No follow-ups on file.        Shelley Beck      *Patient note was created using Office manager. Any errors in syntax or even information may not have been identified and edited on initial review prior to signing this note.*       [  1]   Past Medical History:  Diagnosis Date    Anxiety     managed on lexapro     HIV disease    (CMS-HCC)     Neck pain    [2]   Past Surgical History:  Procedure Laterality Date    CHOLECYSTECTOMY      PR KNEE SCOPE,MED/LAT MENISECTOMY Left 03/02/2022    Procedure: KNEE ARTHROSCOPY PARTIAL MEDIAL MENISECTOMY;  Surgeon: Cordella Milta Orn, MD;  Location: [facility];  Service: Orthopedics    scoliosis repair      uterine ablation     [3]   Current Outpatient Medications   Medication Sig Dispense Refill    ascorbic acid, vitamin C, (VITAMIN C) 1000 MG tablet Take 2 tablets (2,000 mg total) by mouth in the morning.      bictegrav-emtricit-tenofov ala (BIKTARVY ) 50-200-25 mg tablet Take 1 tablet by mouth daily. 30 tablet 11    calcium carbonate (OS-CAL) 1,500 mg (600 mg elem calcium) tablet Take 1 tablet (600 mg elem calcium total) by mouth in the morning.      cyclobenzaprine (FLEXERIL) 10 MG tablet       escitalopram  oxalate (LEXAPRO ) 20 MG tablet Take 1 tablet (20 mg total) by mouth daily. 90 tablet 1    gabapentin (NEURONTIN) 100 MG capsule       hydrOXYzine (ATARAX) 25 MG tablet       ibuprofen (MOTRIN) 800 MG tablet       metFORMIN  (GLUCOPHAGE -XR) 750 MG 24 hr tablet Take 1 tablet (750 mg total) by mouth daily before breakfast. 90 tablet 0    ondansetron  (ZOFRAN -ODT) 4 MG disintegrating tablet       tirzepatide  10 mg/0.2 mL Syrg Inject 44 Units under the skin every seven (7) days.      erythromycin (ROMYCIN) 5 mg/gram (0.5 %) ophthalmic ointment Administer into the left eye Three (3) times a day. Use for 7 days 3.5 g 0    methylPREDNISolone (MEDROL DOSEPACK) 4 mg tablet  (Patient not taking: Reported on 03/23/2024)      naltrexone HCl (NALTREXONE ORAL) Take 0.1 mg by mouth. (Patient not taking: Reported on 03/23/2024)      nitrofurantoin, macrocrystal-monohydrate, (MACROBID) 100 MG capsule Take 1 capsule (100 mg total) by mouth two (2) times a day for 7 days. 14 capsule 0    peg 3350-sod sulf,chlr-pot-mag (SUFLAVE ) 178.7-7.3-0.5 gram SolR Take 3,550 Containers by mouth Take as directed. 1 each 0    sod sulf-pot chloride-mag sulf (SUTAB ) 1.479-0.188- 0.225 gram Tab NO Prior Auth needed w/ card.Please run UNIVERSAL COPAY CARD. Run card as cash for cash pay patients and for commercial, run as secondary COB.BIN [id number], PCN [id number],GROUP [id number],MEMBER ID [id number].  PT TO TAKE AS DIRECTED BY GI. 24 tablet 0     No current facility-administered medications for this visit.

## 2024-05-29 NOTE — Progress Notes (Signed)
 [facility] Specialty and Home Delivery Pharmacy Refill Coordination Note    Specialty Medication(s) to be Shipped:   Infectious Disease: Biktarvy     Other medication(s) to be shipped: No additional medications requested for fill at this time    Specialty Medications not needed at this time: N/A     Shelley Beck, DOB: [birth year]  Phone: [phone] (home)       All above HIPAA information was verified with patient's family member, partner.     Was a nurse, learning disability used for this call? No    Completed refill call assessment today to schedule patient's medication shipment from the [facility] and Home Delivery Pharmacy  [phone]).  All relevant notes have been reviewed.     Specialty medication(s) and dose(s) confirmed: Regimen is correct and unchanged.   Changes to medications: Mariadelcarmen reports no changes at this time.  Changes to insurance: No  New side effects reported not previously addressed with a pharmacist or physician: None reported  Questions for the pharmacist: No    Confirmed patient received a Conservation Officer, Historic Buildings and a Surveyor, Mining with first shipment. The patient will receive a drug information handout for each medication shipped and additional FDA Medication Guides as required.       DISEASE/MEDICATION-SPECIFIC INFORMATION        N/A    SPECIALTY MEDICATION ADHERENCE     Medication Adherence    Patient reported X missed doses in the last month: 0  Specialty Medication: BIKTARVY  50-200-25 mg tablet (bictegrav-emtricit-tenofov ala)  Patient is on additional specialty medications: No              Were doses missed due to medication being on hold? No     BIKTARVY  50-200-25 mg tablet (bictegrav-emtricit-tenofov ala): 0 days of medicine on hand       REFERRAL TO PHARMACIST     Referral to the pharmacist: Not needed      [facility]     Shipping address confirmed in Epic.     Cost and Payment: Patient has a $0 copay, payment information is not required.    Delivery Scheduled: Yes, Expected medication delivery date: 05/31/24.     Medication will be delivered via Next Day Courier to the prescription address in Epic WAM.    Shelley Beck   [facility] Specialty and Home Delivery Pharmacy  Specialty Technician

## 2024-05-30 MED FILL — BIKTARVY 50 MG-200 MG-25 MG TABLET: ORAL | 30 days supply | Qty: 30 | Fill #5

## 2024-06-01 DIAGNOSIS — Z79899 Other long term (current) drug therapy: Principal | ICD-10-CM

## 2024-06-01 DIAGNOSIS — M25561 Pain in right knee: Principal | ICD-10-CM

## 2024-06-01 DIAGNOSIS — Z6835 Body mass index (BMI) 35.0-35.9, adult: Principal | ICD-10-CM

## 2024-06-01 DIAGNOSIS — Z7251 High risk heterosexual behavior: Principal | ICD-10-CM

## 2024-06-01 DIAGNOSIS — Z717 Human immunodeficiency virus [HIV] counseling: Principal | ICD-10-CM

## 2024-06-01 DIAGNOSIS — B20 Human immunodeficiency virus [HIV] disease: Principal | ICD-10-CM

## 2024-06-01 MED ORDER — GABAPENTIN 300 MG CAPSULE
ORAL_CAPSULE | Freq: Every evening | ORAL | 0 refills | 90.00000 days | Status: CP
Start: 2024-06-01 — End: 2024-08-30

## 2024-06-01 MED ORDER — BICTEGRAVIR 50 MG-EMTRICITABINE 200 MG-TENOFOVIR ALAFENAM 25 MG TABLET
ORAL_TABLET | Freq: Every day | ORAL | 11 refills | 30.00000 days | Status: CP
Start: 2024-06-01 — End: 2025-06-01
  Filled 2024-06-22: qty 30, 30d supply, fill #6

## 2024-06-01 NOTE — Progress Notes (Signed)
 Assessment/Plan:      Diagnosis ICD-10-CM Associated Orders   1. Arthralgia of right knee  M25.561 CBC w/ Differential     Metabolic panel, Comp (Chem10 + LFTs)     Lipid Panel     Lipoprotein a (LP(a))     Apolipoprotein B     Insulin Level, Total     Insulin-Like Growth Factor (IGF-1)     gabapentin  (NEURONTIN ) 300 MG capsule     tirzepatide  (ZEPBOUND ) 5 mg/0.5 mL injection pen     Hemoglobin A1c      2. HIV disease    (CMS-HCC)  B20 CBC w/ Differential     Metabolic panel, Comp (Chem10 + LFTs)     HIV RNA, Quantitative, PCR     Lymphocyte Markers Limited     Lipid Panel     bictegrav-emtricit-tenofov ala (BIKTARVY ) 50-200-25 mg tablet     bictegrav-emtricit-tenofov ala (BIKTARVY ) 50-200-25 mg tablet      3. Encounter for HIV counseling  Z71.7 CBC w/ Differential     Metabolic panel, Comp (Chem10 + LFTs)     HIV RNA, Quantitative, PCR     Lymphocyte Markers Limited     Lipid Panel     Lipoprotein a (LP(a))     Apolipoprotein B     Insulin Level, Total     Insulin-Like Growth Factor (IGF-1)     bictegrav-emtricit-tenofov ala (BIKTARVY ) 50-200-25 mg tablet     bictegrav-emtricit-tenofov ala (BIKTARVY ) 50-200-25 mg tablet      4. BMI 35.0-35.9,adult  Z68.35 CBC w/ Differential     Metabolic panel, Comp (Chem10 + LFTs)     HIV RNA, Quantitative, PCR     Lymphocyte Markers Limited     Lipid Panel     Lipoprotein a (LP(a))     Apolipoprotein B     Insulin Level, Total     Insulin-Like Growth Factor (IGF-1)     gabapentin  (NEURONTIN ) 300 MG capsule     bictegrav-emtricit-tenofov ala (BIKTARVY ) 50-200-25 mg tablet     tirzepatide  (ZEPBOUND ) 5 mg/0.5 mL injection pen     Hemoglobin A1c      5. High risk heterosexual behavior  Z72.51 CBC w/ Differential     Metabolic panel, Comp (Chem10 + LFTs)     HIV RNA, Quantitative, PCR     Lymphocyte Markers Limited     Lipid Panel     Lipoprotein a (LP(a))     Apolipoprotein B     Insulin Level, Total     Insulin-Like Growth Factor (IGF-1)     bictegrav-emtricit-tenofov ala (BIKTARVY ) 50-200-25 mg tablet     bictegrav-emtricit-tenofov ala (BIKTARVY ) 50-200-25 mg tablet      6. Encounter for medication management  Z79.899 CBC w/ Differential     Metabolic panel, Comp (Chem10 + LFTs)     HIV RNA, Quantitative, PCR     Lymphocyte Markers Limited     Lipid Panel     Lipoprotein a (LP(a))     Apolipoprotein B     Insulin Level, Total     Insulin-Like Growth Factor (IGF-1)     gabapentin  (NEURONTIN ) 300 MG capsule     bictegrav-emtricit-tenofov ala (BIKTARVY ) 50-200-25 mg tablet     tirzepatide  (ZEPBOUND ) 5 mg/0.5 mL injection pen     bictegrav-emtricit-tenofov ala (BIKTARVY ) 50-200-25 mg tablet     Hemoglobin A1c          Return in about 6 weeks (around 07/13/2024).    HPI:  Shelley Beck is a 48 y.o. year old Black/African American female who presents today for follow-up. Referred by [facility] for new HIV diagnosis in 2022. Health history includes bilateral knee OA with pain, obesity, HIV. Patient is a beautiful, intelligent travel nurse. Working at [employer] right now. Had expereienced cervical lymphadenopathy, swollen tonsils, and fatigue prior to HIV diagnosis. Patient has been taking ART Biktarvy  consistently with no missed doses or bothersome side effects.  Denies fever, chills, NVD, night sweats. Went in for routine labs and HIV1 antibody returned positive. Her boyfriend, Pearly, has been tested and is HIV positive also, on Biktarvy .  HIV well-controlled, last CD4% 42 Absolute CD4 756 HIV RNA not detected. Hep ABC negative, RPR non-reactive. CMV positive.  Discussed HIV biology, medication resistance, medication compliance, safe sex with condoms, U=U. Potential medication side effects discussed. HIV labs today.  Patient will continue Biktarvy  daily discussed the option of Cabenuva injections.      Patient has complaints of ongoing bilateral knee pain for the last 6 months.  Endorses history of right knee procedure with continued knee pain.  States steroids do improve pain.  States gabapentin  does relieve some of the neurological pain.  Will refill gabapentin  300 mg daily today.  Encourage patient to continue weightbearing exercises to strengthen knee and continued weight loss.  She has been taking tirzepatide  from a compounding pharmacy and doing well with weight loss.    Patient has new insurance and would like to continue tirzepatide . Patient has followed healthy diet and exercise routines for many years without much weight loss. Sensible meal or protein shake for dinner. BMI 35.89 202 pounds. Patient has tried dietary and lifestyle modifications including calorie restrictions, weight loss behavior modifications, and increased physical activity for more than six months and patient has been unable to lose 5% of body weight. It is medically necessary for patient to add pharmacotherapy to their obesity treatment plan since they have been unable to achieve clinically significant results with active enrollment and participation in lifestyle changes. Patient will continue lifestyle modifications in addition to starting pharmacotherapy to treat their obesity, including reduced calorie diet as specified below, increased physical activity as detailed below and behavioral changes. Patient's weight today is 202 pounds. Pharmacotherapy is recommended following AACE guidelines. Will provide patient with sample medication if available.        Patient will continue administering Zepbound  (Tirzepatide ) 5 mg/0.5 ml injectable every 7 days for 28 days. Side effects including dry mouth, some nausea and no appetite discussed. Patient will increase daily water intake.  Discussed getting adequate amout of daily protein around 125-130g (0.7g per pound body weight) daily to ensure minimal muscle loss. Denies family history of medullary thyroid cancers or chronic nausea and gallbladder issues.       Diet  Eating 6 meals per day instead of 3 meals  Limiting intake to 1200-1600 calories per day  Intermittent fasting has been implemented     Dietary goal: Recommended low carbohydrates, smaller portions, and decrease fast food to reduce calorie intake.  Maintain 1200-1600 calories per day.  Goal is 1-2 pounds weight loss per week.  Discussed weight loss plateau. Will increase daily activity with walking per guidelines below.       Physical Activity (Physical exercise program appropriate for age, physical condition, including expecatation for compliance, and recommendations discussed).  -Discussed need for at least 150 minutes of moderate aerobic activity weekly (including swimming, walking, jogging, biking, etc.  -Recommended exercise goal:  Recommend daily walking with increasing time/duration. Goal is 60 minutes/day, as tolerated.  Recommend 5-7 days/week.  60-90 minutes of moderate intensity physical activity most days out of the week (5-7 days preferred).       Behavioral Intervention (Specific strategies and tools for overcoming barriers and improving dietary compliance discussed).  -Rec. Behavioral goals:  Self-monitoring food intake (keep written or digital meal diary that tracks all meals, snacks and beverages), stress management, logbook of food intake and physical activity, social support.  Food chart with high protein foots listed provided to patient      Pharmacotherapy: Will continue Zepbound  (Tirzepatide ) 5 mg/0.5 ml Pnlj subcutaneous every days for 28 days.  Will titrate dose according to patient tolerability.  Medication side effects discussed. Injection demonstration completed. Patient able to self-inject appropriately. Will advise patient about reduction of antihypertensive dose if blood pressure reduces because of weight loss.      Phentermine: previous trial date______ OR contraindication  Qsymia: previous trial date ________ OR contraindication   Contrave: previous trial date _______ OR contraindication     Exercise: patient is part of comprehensive lifestyle program that assists patient in adhering to a lower-calorie diet and increasing physical activity via behavioral strategies. Patient is engaging in minimum of 150 minutes moderate intensity exercise per week consisting of walking     Recommend daily walking with increased time/duration. Goal is 60 minutes/day, as tolerated. Recommend 5-7 days/week. 60-90 minutes of moderate intensity physical activity most days out of the week (5-7 days preferred).      Nutrition: Patient is working to improve diet via a 500 kcal/day deficit. TDEE for patient (expected kcal/day) 2734 so 500 kcal deficit is 2234 cal/day.     Additionally, patient is using an evidence-based diet that restricts certain food groups including reducing high-carbohydrate foods, low-fiber foods, and high-fat foods in order to create energy deficit.      Patient is actively engaged in a nutritional counseling program.      Behavioral intervention: specific strategies and tools for overcoming barriers and improving dietary compliance discussed     Re. Behavioral goal: self monitoring food intake (keep written or digital meal diary that tracks all meals, snack and beverages), stress management, logbook of food intake and physical activity , social support      Obesity is a chronic, relapsing, progressive, multi factorial, neurobehavioral disease, where, in an increase in body, fat, promotes adipose, tissue dysfunction, as well as biochemical stress on the body, causing adverse metabolic, biochemical, and psychosocial health consequences, in addition to reducing quality of life and lifespan. Without treatment, the obesity is likely to progress and worsen, further increasing the patient???s risk for numerous health conditions caused by excess weight and increasing the risk for premature death. The patient was canceled on the science of weight and appetite regulation. The pathophysiology of obesity was reviewed, as well as a biologic adaptations to weight loss. we reviewed the role of nutrition, physical activity, stress, sleep, and potentially bariatric surgery and or anti-obesity medication???s on the treatment of obesity. We reviewed the health impacts of excess weight, and the health benefits of a 5 to 10% weight loss.  We also reviewed program expectations and coordination of care. All questions were answered.     Patient is aware inventory of low/starting doses may be in short supply. Follow up in 6 weeks.       Past Medical/Surgical History:     Past Medical History[1]  Past Surgical History[2]    Social History:  Social History[3]    Family History:     Family History[4]    Allergies:     Tramadol     Current Medications:     Current Medications[5]    ROS:   Review of Systems   Constitutional: Negative.    HENT: Negative.     Eyes: Negative.    Respiratory: Negative.     Cardiovascular: Negative.    Gastrointestinal: Negative.    Endocrine: Negative.    Genitourinary: Negative.    Musculoskeletal:  Positive for arthralgias and joint swelling.   Skin: Negative.    Allergic/Immunologic: Positive for immunocompromised state.   Neurological: Negative.    Hematological: Negative.    Psychiatric/Behavioral: Negative.          Vital Signs:     There were no vitals filed for this visit.  BP Readings from Last 3 Encounters:   03/23/24 135/84   12/08/23 148/93   11/02/22 122/81     Wt Readings from Last 3 Encounters:   03/23/24 91.9 kg (202 lb 9.6 oz)   12/08/23 98.4 kg (217 lb)   11/02/22 (!) 105.2 kg (232 lb)       Immunizations:     Immunization History   Administered Date(s) Administered    COVID-19 VACC,MRNA,(PFIZER)(PF) 07/24/2019, 08/14/2019, 04/02/2020    Influenza Virus Vaccine, unspecified formulation 03/13/2017    PPD Test 01/16/2017, 03/26/2020, 10/28/2020, 12/01/2020    TdaP 10/28/2020       Labs:     Platelet   Date Value Ref Range Status   11/02/2022 274 150 - 450 10*9/L Final     HGB   Date Value Ref Range Status   11/02/2022 12.0 11.3 - 14.9 g/dL Final     WBC   Date Value Ref Range Status   11/02/2022 5.6 3.6 - 11.2 10*9/L Final     BUN   Date Value Ref Range Status   11/02/2022 13 9 - 23 mg/dL Final     Creatinine   Date Value Ref Range Status   11/02/2022 0.79 0.55 - 1.02 mg/dL Final     ALT   Date Value Ref Range Status   11/02/2022 18 10 - 49 U/L Final     AST   Date Value Ref Range Status   11/02/2022 27 <34 U/L Final     Potassium   Date Value Ref Range Status   11/02/2022 4.1 3.5 - 5.1 mmol/L Final     CO2   Date Value Ref Range Status   11/02/2022 29.0 20.0 - 31.0 mmol/L Final     HIV RNA   Date Value Ref Range Status   11/02/2022 <20 (H) <0 copies/mL Final     Hepatitis C Ab   Date Value Ref Range Status   10/24/2020 Nonreactive Nonreactive Final     Gonorrhoeae NAA   Date Value Ref Range Status   10/24/2020 Negative Negative Final     Chlamydia trachomatis, NAA   Date Value Ref Range Status   10/24/2020 Negative Negative Final     Urine Culture, Comprehensive   Date Value Ref Range Status   03/23/2024   Final    Mixed organisms isolated; Mixed organisms suggests poorly collected specimen. Please resubmit if clinically indicated.     CD4% (T Helper)   Date Value Ref Range Status   11/02/2022 42 34 - 58 % Final         Physical Exam:   Physical Exam  Vitals and nursing note reviewed.  Constitutional:       Appearance: Normal appearance. She is obese.   HENT:      Head: Normocephalic and atraumatic.      Right Ear: External ear normal.      Left Ear: External ear normal.      Nose: Nose normal.      Mouth/Throat:      Mouth: Mucous membranes are moist.      Pharynx: Oropharynx is clear.   Eyes:      Extraocular Movements: Extraocular movements intact.      Conjunctiva/sclera: Conjunctivae normal.      Pupils: Pupils are equal, round, and reactive to light.   Cardiovascular:      Rate and Rhythm: Normal rate and regular rhythm.      Pulses: Normal pulses.      Heart sounds: Normal heart sounds.   Pulmonary:      Effort: Pulmonary effort is normal.      Breath sounds: Normal breath sounds.   Abdominal:      General: Abdomen is flat. Bowel sounds are normal.      Palpations: Abdomen is soft.   Musculoskeletal:         General: Normal range of motion.      Cervical back: Normal range of motion and neck supple.   Skin:     General: Skin is warm.      Capillary Refill: Capillary refill takes less than 2 seconds.   Neurological:      General: No focal deficit present.      Mental Status: She is alert and oriented to person, place, and time.   Psychiatric:         Mood and Affect: Mood normal.         Behavior: Behavior normal.         Thought Content: Thought content normal.         Judgment: Judgment normal.               [1]   Past Medical History:  Diagnosis Date    Anxiety     managed on lexapro     HIV disease    (CMS-HCC)     Neck pain    [2]   Past Surgical History:  Procedure Laterality Date    CHOLECYSTECTOMY      PR KNEE SCOPE,MED/LAT MENISECTOMY Left 03/02/2022    Procedure: KNEE ARTHROSCOPY PARTIAL MEDIAL MENISECTOMY;  Surgeon: Cordella Milta Orn, MD;  Location: [facility];  Service: Orthopedics    scoliosis repair      uterine ablation     [3]   Social History  Socioeconomic History    Marital status: Divorced     Spouse name: None    Number of children: None    Years of education: None    Highest education level: None   Tobacco Use    Smoking status: Never    Smokeless tobacco: Never    Tobacco comments:     Vape occasionally    Vaping Use    Vaping status: Some Days    Substances: Nicotine, Flavoring   Substance and Sexual Activity    Alcohol use: Yes     Alcohol/week: 1.0 standard drink of alcohol     Types: 1 Glasses of wine per week     Comment: socially    Drug use: Never    Sexual activity: Yes     Partners: Male     Social Drivers of Health  Tobacco Use: Low Risk (06/01/2024)    Patient History     Smoking Tobacco Use: Never     Smokeless Tobacco Use: Never   Health Literacy: Low Risk (10/27/2021)    Health Literacy     : Never   [4]   Family History  Problem Relation Age of Onset    No Known Problems Mother     No Known Problems Father    [5]   Current Outpatient Medications   Medication Sig Dispense Refill    ascorbic acid, vitamin C, (VITAMIN C) 1000 MG tablet Take 2 tablets (2,000 mg total) by mouth in the morning.      bictegrav-emtricit-tenofov ala (BIKTARVY ) 50-200-25 mg tablet Take 1 tablet by mouth daily. 30 tablet 11    bictegrav-emtricit-tenofov ala (BIKTARVY ) 50-200-25 mg tablet Take 1 tablet by mouth daily. 30 tablet 11    bictegrav-emtricit-tenofov ala (BIKTARVY ) 50-200-25 mg tablet Take 1 tablet by mouth daily for 7 days. 7 tablet 0    calcium carbonate (OS-CAL) 1,500 mg (600 mg elem calcium) tablet Take 1 tablet (600 mg elem calcium total) by mouth in the morning.      cyclobenzaprine (FLEXERIL) 10 MG tablet       erythromycin  (ROMYCIN) 5 mg/gram (0.5 %) ophthalmic ointment Administer into the left eye Three (3) times a day. Use for 7 days 3.5 g 0    escitalopram  oxalate (LEXAPRO ) 20 MG tablet Take 1 tablet (20 mg total) by mouth daily. 90 tablet 1    gabapentin  (NEURONTIN ) 300 MG capsule Take 1 capsule (300 mg total) by mouth nightly. 90 capsule 0    hydrOXYzine (ATARAX) 25 MG tablet       ibuprofen (MOTRIN) 800 MG tablet       metFORMIN  (GLUCOPHAGE -XR) 750 MG 24 hr tablet Take 1 tablet (750 mg total) by mouth daily before breakfast. 90 tablet 0    methylPREDNISolone (MEDROL DOSEPACK) 4 mg tablet  (Patient not taking: Reported on 03/23/2024)      naltrexone HCl (NALTREXONE ORAL) Take 0.1 mg by mouth. (Patient not taking: Reported on 03/23/2024)      ondansetron  (ZOFRAN -ODT) 4 MG disintegrating tablet       peg 3350-sod sulf,chlr-pot-mag (SUFLAVE ) 178.7-7.3-0.5 gram SolR Take 3,550 Containers by mouth Take as directed. 1 each 0    sod sulf-pot chloride-mag sulf (SUTAB ) 1.479-0.188- 0.225 gram Tab NO Prior Auth needed w/ card.Please run [employer] CARD. Run card as cash for cash pay patients and for commercial, run as secondary COB.BIN [id number], PCN [id number],GROUP [id number],MEMBER ID [id number].  PT TO TAKE AS DIRECTED BY GI. 24 tablet 0    [START ON 06/29/2024] tirzepatide  (ZEPBOUND ) 5 mg/0.5 mL injection pen Inject 5 mg under the skin every seven (7) days for 4 doses. 2 mL 0     No current facility-administered medications for this visit.

## 2024-06-04 NOTE — Progress Notes (Addendum)
 Name: Zian Mohamed  Date of Birth: 06/13/1976    Subjective:  History of Present Illness  Abiageal Blowe is a 48 year old female who presents for return evaluation of right knee pain.    She has been experiencing persistent and worsening right knee pain over the past year, particularly in the anterior aspect of the knee. 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 She has altered her gait due to the pain and has difficulty with activities such as getting in and out of her vehicle.    An MRI was ordered when she was last evaluated 6 months ago with degenerative changes, including a tear in the medial meniscus. She has not experienced any recent injury or fall since her last visit.    Her current pain management includes taking up to Tylenol  and ibuprofen, but this provides only limited relief of her pain. She has tried meloxicam in the past without relief. She had an intraarticular corticosteroid injection 6 months ago that did not provide relief of her knee pain.    Her social history includes occasional nicotine vaping. She has a history of back surgery for scoliosis at age 37 and a previous meniscus repair on the left knee.    I have reviewed past medical, surgical, medication, allergy, social and family histories today and updated them in Epic where appropriate.    ROS:   Review of systems negative unless otherwise noted as per HPI.    Social History[1]    Current Medications[2]     Objective:       06/06/24 0813   BP: 126/79   Pulse: 93   Weight: 91.6 kg (202 lb)   Height: 160 cm (5' 3)      Body mass index is 35.78 kg/m??.    Physical Exam:    General/Constitutional: No apparent distress: well-nourished and well developed.    Respiratory: Non-labored breathing     Vascular: No edema, swelling or tenderness, except as noted in detailed exam.    Integumentary: No impressive skin lesions present, except as noted in detailed exam.    Musculoskeletal: Normal, except as noted in detailed exam and in HPI.    Physical Exam  MUSCULOSKELETAL:     Lumbar spine  Nontender  Negative R SLR    Right knee  Skin intact  No swelling, ecchymosis, or erythema  Trace effusion.  Nontender to palpation  ROM 0 - 120, with pain and palpable crepitus over anterior knee on extension  No pain to groin/hip with passive IR/ER of hip  Strength 5/5h, 5/5q  Stable to a/p/v/v stress  Positive McMurray's test    lower extremity motor and neuro exam:  gastroc-soleus complex/tibialis anterior/ and extensor hallucis longus motor intact; sensation intact to light touch saphenous/sural/ superficial peroneal/ deep peroneal/ and tibial nerve distribution, warm and well perfused    DP pulse palpable    XRAY: No results found.    MRI 12/12/23  Impression    1.    Degenerated free edge oblique tear medial meniscus posterior horn with candidate for superior peripheral flap along the posterior root as described. Additional small chronic horizontal tear of the body. No extrusion.  2.    Medial and patellofemoral predominant osteoarthrosis with focal high-grade chondral defects as described. High-grade chondral fissuring of the patella with equivocal nondisplaced chondral flap formation.  3.    Mild subchondral edema of the medial femoral condyle, which  may be degenerative or mild osseous contusion.  4.    Grade 1 sprain of medial collateral ligament.  5.    Early mucoid degeneration of anterior cruciate ligament.  6.    Small joint effusion and synovitis.  7.    Mild scattered tendinopathy as above.    Signed (Electronic Signature): 12/18/2021 3:34 PM  Signed By: Bernardino Drones, MD    Lg Joint Inj: R knee on 06/06/2024 8:30 AM  Indications: pain  Details: 22 G needle, anterolateral approach  Laterality: right  Location: knee  Medications: 20 mg sodium hyaluronate (viscosup) 10 mg/mL(mw 2.4 -3.6 million)  Outcome: tolerated well, no immediate complications  Procedure, treatment alternatives, risks and benefits explained, specific risks discussed. Consent was given by the patient. Immediately prior to procedure a time out was called to verify the correct patient, procedure, equipment, support staff and site/side marked as required. Patient was prepped and draped in the usual sterile fashion.     Medical Care Team Attestation: All ProcDoc orders were read back and verbally confirmed with the procedure provider, including but not limited to patient name, medication name, dose, and route, before any actions were taken.  Provider Attestation: The information documented by members of my medical care team was reviewed and verified for accuracy by me.        Assessment & Plan:    Encounter Diagnosis   Name Primary?    Primary osteoarthritis of right knee Yes     Assessment & Plan  Primary osteoarthritis of right knee with medial meniscus tear and degenerative changes  Chronic right knee pain with medial meniscus tear and high-grade chondral defects, tricompartmental degenerative changes worst to medial compartment and patellofemoral compartment. Pain control limited with tylenol  and ibuprofen, prior intraarticular corticosteroid injection ineffective. Discussed conservative management including weight loss, physical therapy, home exercises, and trial of HA injections given limited efficacy of prior CSI and NSAID use. In addition, discussed trial of celebrex . Patient in agreement with HA injection today after risks and benefits, received Euflexxa #1, will return next week for Euflexxa #2 with either Dr. Robinson or Dr. Benjamine as I will be out.     Discussed with her if conservative management fails definitive management would be with knee replacement, she wishes to avoid surgery as able.    - Administered Euflexxa #1 injection to the right knee.  - Referred to physical therapy for right knee.  - Provided home exercises for knee arthritis.  - Prescribed celebrex .    Follow-up for R. Knee Euflexxa #2 in one week as above.    Return in about 1 week (around 06/13/2024).    Documentation of this office visit was completed with the aid of Dragon Medical voice recognition software.  Transcription may contain typographical errors.     Debby Vannie Budge, [place]  06/06/2024   2:35 PM          [1]   Social History  Socioeconomic History    Marital status: Divorced     Spouse name: None    Number of children: None    Years of education: None    Highest education level: None   Tobacco Use    Smoking status: Never    Smokeless tobacco: Never    Tobacco comments:     Vape occasionally    Vaping Use    Vaping status: Some Days    Substances: Nicotine, Flavoring   Substance and Sexual Activity    Alcohol use: Yes  Alcohol/week: 1.0 standard drink of alcohol     Types: 1 Glasses of wine per week     Comment: socially    Drug use: Never    Sexual activity: Yes     Partners: Male     Social Drivers of Health     Tobacco Use: Low Risk (06/06/2024)    Patient History     Smoking Tobacco Use: Never     Smokeless Tobacco Use: Never   Health Literacy: Low Risk (10/27/2021)    Health Literacy     : Never   [2]   Current Outpatient Medications:     ascorbic acid, vitamin C, (VITAMIN C) 1000 MG tablet, Take 2 tablets (2,000 mg total) by mouth in the morning., Disp: , Rfl:     bictegrav-emtricit-tenofov ala (BIKTARVY ) 50-200-25 mg tablet, Take 1 tablet by mouth daily., Disp: 30 tablet, Rfl: 11    bictegrav-emtricit-tenofov ala (BIKTARVY ) 50-200-25 mg tablet, Take 1 tablet by mouth daily., Disp: 30 tablet, Rfl: 11    bictegrav-emtricit-tenofov ala (BIKTARVY ) 50-200-25 mg tablet, Take 1 tablet by mouth daily for 7 days., Disp: 7 tablet, Rfl: 0    calcium carbonate (OS-CAL) 1,500 mg (600 mg elem calcium) tablet, Take 1 tablet (600 mg elem calcium total) by mouth in the morning., Disp: , Rfl:     celecoxib  (CELEBREX ) 200 MG capsule, Take 1 capsule (200 mg total) by mouth daily., Disp: 30 capsule, Rfl: 2    cyclobenzaprine (FLEXERIL) 10 MG tablet, , Disp: , Rfl:     erythromycin  (ROMYCIN) 5 mg/gram (0.5 %) ophthalmic ointment, Administer into the left eye Three (3) times a day. Use for 7 days, Disp: 3.5 g, Rfl: 0    escitalopram  oxalate (LEXAPRO ) 20 MG tablet, Take 1 tablet (20 mg total) by mouth daily., Disp: 90 tablet, Rfl: 1    gabapentin  (NEURONTIN ) 300 MG capsule, Take 1 capsule (300 mg total) by mouth nightly., Disp: 90 capsule, Rfl: 0    hydrOXYzine (ATARAX) 25 MG tablet, , Disp: , Rfl:     ibuprofen (MOTRIN) 800 MG tablet, , Disp: , Rfl:     metFORMIN  (GLUCOPHAGE -XR) 750 MG 24 hr tablet, Take 1 tablet (750 mg total) by mouth daily before breakfast., Disp: 90 tablet, Rfl: 0    methylPREDNISolone (MEDROL DOSEPACK) 4 mg tablet, , Disp: , Rfl:     naltrexone HCl (NALTREXONE ORAL), Take 0.1 mg by mouth. (Patient not taking: Reported on 03/23/2024), Disp: , Rfl:     ondansetron  (ZOFRAN -ODT) 4 MG disintegrating tablet, , Disp: , Rfl:     peg 3350-sod sulf,chlr-pot-mag (SUFLAVE ) 178.7-7.3-0.5 gram SolR, Take 3,550 Containers by mouth Take as directed., Disp: 1 each, Rfl: 0    sod sulf-pot chloride-mag sulf (SUTAB ) 1.479-0.188- 0.225 gram Tab, NO Prior Auth needed w/ card.Please run UNIVERSAL COPAY CARD. Run card as cash for cash pay patients and for commercial, run as secondary COB.BIN [id number], PCN 2001,GROUP [id number],MEMBER ID [id number].  PT TO TAKE AS DIRECTED BY GI., Disp: 24 tablet, Rfl: 0    [START ON 06/29/2024] tirzepatide  (ZEPBOUND ) 5 mg/0.5 mL injection pen, Inject 5 mg under the skin every seven (7) days for 4 doses., Disp: 2 mL, Rfl: 0  No current facility-administered medications for this visit.

## 2024-06-06 DIAGNOSIS — M1711 Unilateral primary osteoarthritis, right knee: Principal | ICD-10-CM

## 2024-06-06 MED ORDER — CELECOXIB 200 MG CAPSULE
ORAL_CAPSULE | Freq: Every day | ORAL | 2 refills | 30.00000 days | Status: CP
Start: 2024-06-06 — End: 2025-06-06

## 2024-06-06 MED ADMIN — sodium hyaluronate (viscosup) (EUFLEXXA) 10 mg/mL injection 20 mg: 20 mg | @ 14:00:00 | Stop: 2024-06-06

## 2024-06-12 NOTE — Telephone Encounter (Signed)
 Patient called and said that she was wanting to get an update on her Zepbound . I let her know that I would pass this along to you so that she can get an update on the medication whether it has been approved or not through her insurance.

## 2024-06-13 DIAGNOSIS — M25562 Pain in left knee: Principal | ICD-10-CM

## 2024-06-13 MED ADMIN — sodium hyaluronate (viscosup) (EUFLEXXA) 10 mg/mL injection 20 mg: 20 mg | @ 14:00:00 | Stop: 2024-06-13

## 2024-06-13 NOTE — Progress Notes (Addendum)
 Name: Shelley Beck  Date of Birth: [birth year]    Subjective: Shelley Beck returns for her right knee. Got euflexxa 1 last week. Here for number 2.    I have reviewed past medical, surgical, medication, allergy, social and family histories today and updated them in Epic where appropriate.    ROS:   Review of systems negative unless otherwise noted as per HPI.    Social History[1]    Current Medications[2]     Objective:       06/13/24 0919   BP: 122/75   Pulse: 82   Weight: 91.6 kg (202 lb)   Height: 160 cm (5' 3)      Body mass index is 35.78 kg/m??.    Physical Exam:    General/Constitutional: No apparent distress: well-nourished and well developed.    Respiratory: Non-labored breathing     Vascular: No edema, swelling or tenderness, except as noted in detailed exam.    Integumentary: No impressive skin lesions present, except as noted in detailed exam.    Musculoskeletal: Normal, except as noted in detailed exam and in HPI.    Physical Exam  MUSCULOSKELETAL:     Lumbar spine  Nontender  Negative R SLR    Right knee  Skin intact  No swelling, ecchymosis, or erythema  Trace effusion.  Nontender to palpation  ROM 0 - 120, with pain and palpable crepitus over anterior knee on extension  No pain to groin/hip with passive IR/ER of hip  Strength 5/5h, 5/5q  Stable to a/p/v/v stress  Positive McMurray's test    lower extremity motor and neuro exam:  gastroc-soleus complex/tibialis anterior/ and extensor hallucis longus motor intact; sensation intact to light touch saphenous/sural/ superficial peroneal/ deep peroneal/ and tibial nerve distribution, warm and well perfused    DP pulse palpable    XRAY: No results found.  Lg Joint Inj: R knee on 06/13/2024 9:15 AM  Indications: pain  Details: 22 G needle, anterolateral approach  Laterality: right  Location: knee  Medications: 20 mg sodium hyaluronate (viscosup) 10 mg/mL(mw 2.4 -3.6 million)  Outcome: tolerated well, no immediate complications  Procedure, treatment alternatives, risks and benefits explained, specific risks discussed. Consent was given by the patient. Immediately prior to procedure a time out was called to verify the correct patient, procedure, equipment, support staff and site/side marked as required. Patient was prepped and draped in the usual sterile fashion.     Medical Care Team Attestation: All ProcDoc orders were read back and verbally confirmed with the procedure provider, including but not limited to patient name, medication name, dose, and route, before any actions were taken.  Provider Attestation: The information documented by members of my medical care team was reviewed and verified for accuracy by me.        Assessment & Plan: euflexxa 2 given. RTC 1 week for #3.       [1]   Social History  Socioeconomic History    Marital status: Divorced   Tobacco Use    Smoking status: Never    Smokeless tobacco: Never    Tobacco comments:     Vape occasionally    Vaping Use    Vaping status: Some Days    Substances: Nicotine, Flavoring   Substance and Sexual Activity    Alcohol use: Yes     Alcohol/week: 1.0 standard drink of alcohol     Types: 1 Glasses of wine per week     Comment: socially    Drug  use: Never    Sexual activity: Yes     Partners: Male     Social Drivers of Health     Tobacco Use: Low Risk (06/06/2024)    Patient History     Smoking Tobacco Use: Never     Smokeless Tobacco Use: Never   Health Literacy: Low Risk (10/27/2021)    Health Literacy     : Never   [2]   Current Outpatient Medications:     ascorbic acid, vitamin C, (VITAMIN C) 1000 MG tablet, Take 2 tablets (2,000 mg total) by mouth in the morning., Disp: , Rfl:     bictegrav-emtricit-tenofov ala (BIKTARVY ) 50-200-25 mg tablet, Take 1 tablet by mouth daily., Disp: 30 tablet, Rfl: 11    bictegrav-emtricit-tenofov ala (BIKTARVY ) 50-200-25 mg tablet, Take 1 tablet by mouth daily., Disp: 30 tablet, Rfl: 11    calcium carbonate (OS-CAL) 1,500 mg (600 mg elem calcium) tablet, Take 1 tablet (600 mg elem calcium total) by mouth in the morning., Disp: , Rfl:     celecoxib  (CELEBREX ) 200 MG capsule, Take 1 capsule (200 mg total) by mouth daily., Disp: 30 capsule, Rfl: 2    cyclobenzaprine (FLEXERIL) 10 MG tablet, , Disp: , Rfl:     erythromycin  (ROMYCIN) 5 mg/gram (0.5 %) ophthalmic ointment, Administer into the left eye Three (3) times a day. Use for 7 days, Disp: 3.5 g, Rfl: 0    escitalopram  oxalate (LEXAPRO ) 20 MG tablet, Take 1 tablet (20 mg total) by mouth daily., Disp: 90 tablet, Rfl: 1    gabapentin  (NEURONTIN ) 300 MG capsule, Take 1 capsule (300 mg total) by mouth nightly., Disp: 90 capsule, Rfl: 0    hydrOXYzine (ATARAX) 25 MG tablet, , Disp: , Rfl:     ibuprofen (MOTRIN) 800 MG tablet, , Disp: , Rfl:     metFORMIN  (GLUCOPHAGE -XR) 750 MG 24 hr tablet, Take 1 tablet (750 mg total) by mouth daily before breakfast., Disp: 90 tablet, Rfl: 0    methylPREDNISolone (MEDROL DOSEPACK) 4 mg tablet, , Disp: , Rfl:     naltrexone HCl (NALTREXONE ORAL), Take 0.1 mg by mouth. (Patient not taking: Reported on 03/23/2024), Disp: , Rfl:     ondansetron  (ZOFRAN -ODT) 4 MG disintegrating tablet, , Disp: , Rfl:     peg 3350-sod sulf,chlr-pot-mag (SUFLAVE ) 178.7-7.3-0.5 gram SolR, Take 3,550 Containers by mouth Take as directed., Disp: 1 each, Rfl: 0    sod sulf-pot chloride-mag sulf (SUTAB ) 1.479-0.188- 0.225 gram Tab, NO Prior Auth needed w/ card.Please run UNIVERSAL COPAY CARD. Run card as cash for cash pay patients and for commercial, run as secondary COB.BIN [id number], PCN 2001,GROUP [id number],MEMBER ID [id number].  PT TO TAKE AS DIRECTED BY GI., Disp: 24 tablet, Rfl: 0    [START ON 06/29/2024] tirzepatide  (ZEPBOUND ) 5 mg/0.5 mL injection pen, Inject 5 mg under the skin every seven (7) days for 4 doses., Disp: 2 mL, Rfl: 0

## 2024-06-19 NOTE — Progress Notes (Signed)
 [facility] Refill Coordination Note    Specialty Medication(s) to be Shipped:   Infectious Disease: Biktarvy  and Specialty Lite: Zepbound     Other medication(s) to be shipped: No additional medications requested for fill at this time    Specialty Medications not needed at this time: N/A     Shelley Beck, DOB: 22-May-1976  Phone: [phone] (home)       All above HIPAA information was verified with patient.     Was a nurse, learning disability used for this call? No    Completed refill call assessment today to schedule patient's medication shipment from the [facility]  [phone]).  All relevant notes have been reviewed.     Specialty medication(s) and dose(s) confirmed: Regimen is correct and unchanged.   Changes to medications: Yaritza reports no changes at this time.  Changes to insurance: No  New side effects reported not previously addressed with a pharmacist or physician: None reported  Questions for the pharmacist: No    Confirmed patient received a Conservation Officer, Historic Buildings and a Surveyor, Mining with first shipment. The patient will receive a drug information handout for each medication shipped and additional FDA Medication Guides as required.       DISEASE/MEDICATION-SPECIFIC INFORMATION        N/A    SPECIALTY MEDICATION ADHERENCE     Medication Adherence    Patient reported X missed doses in the last month: 0  Specialty Medication: BIKTARVY  50-200-25 mg tablet (bictegrav-emtricit-tenofov ala)  Patient is on additional specialty medications: No              Were doses missed due to medication being on hold? No      BIKTARVY  50-200-25 mg tablet (bictegrav-emtricit-tenofov ala): 10 doses of medicine on hand       Specialty medication is an injection or given on a cycle: No    REFERRAL TO PHARMACIST     Referral to the pharmacist: Not needed      [facility]     Shipping address confirmed in Epic.     Cost and Payment: Patient has a copay of $30. They are aware and have authorized the pharmacy to charge the credit card on file.    Delivery Scheduled: Yes, Expected medication delivery date: 06/22/24.     Medication will be delivered via same day courier to the prescription address in Epic WAM.    Shelley Beck   [facility]  Specialty Technician

## 2024-06-22 MED FILL — ZEPBOUND 5 MG/0.5 ML SUBCUTANEOUS PEN INJECTOR: SUBCUTANEOUS | 28 days supply | Qty: 2 | Fill #0

## 2024-06-29 DIAGNOSIS — M25561 Pain in right knee: Principal | ICD-10-CM

## 2024-06-29 MED ORDER — ZEPBOUND 5 MG/0.5 ML SUBCUTANEOUS PEN INJECTOR
SUBCUTANEOUS | 0 refills | 0.00000 days | Status: CP
Start: 2024-06-29 — End: 2024-07-21

## 2024-06-29 MED ADMIN — sodium hyaluronate (viscosup) (EUFLEXXA) 10 mg/mL injection 20 mg: 20 mg | @ 14:00:00 | Stop: 2024-06-29

## 2024-06-29 NOTE — Progress Notes (Signed)
 Name: Twilla Khouri  Date of Birth: 16-Sep-1975    Subjective: Shelley Beck returns for her right knee. Got euflexxa 2 last week. Here for number 3.    I have reviewed past medical, surgical, medication, allergy, social and family histories today and updated them in Epic where appropriate.    ROS:   Review of systems negative unless otherwise noted as per HPI.    Social History[1]    Current Medications[2]     Objective:       06/29/24 0922   BP: 124/74   Pulse: 86   Weight: 91.6 kg (202 lb)   Height: 160 cm (5' 3)      Body mass index is 35.78 kg/m??.    Physical Exam:    General/Constitutional: No apparent distress: well-nourished and well developed.    Respiratory: Non-labored breathing     Vascular: No edema, swelling or tenderness, except as noted in detailed exam.    Integumentary: No impressive skin lesions present, except as noted in detailed exam.    Musculoskeletal: Normal, except as noted in detailed exam and in HPI.    Physical Exam  MUSCULOSKELETAL:     Lumbar spine  Nontender  Negative R SLR    Right knee  Skin intact  No swelling, ecchymosis, or erythema  Trace effusion.  Nontender to palpation  ROM 0 - 120, with pain and palpable crepitus over anterior knee on extension  No pain to groin/hip with passive IR/ER of hip  Strength 5/5h, 5/5q  Stable to a/p/v/v stress  Positive McMurray's test    lower extremity motor and neuro exam:  gastroc-soleus complex/tibialis anterior/ and extensor hallucis longus motor intact; sensation intact to light touch saphenous/sural/ superficial peroneal/ deep peroneal/ and tibial nerve distribution, warm and well perfused    DP pulse palpable    XRAY: No results found.  Lg Joint Inj: R knee on 06/29/2024 8:30 AM  Indications: pain  Details: 22 G needle, anterolateral approach  Laterality: right  Location: knee  Medications: 20 mg sodium hyaluronate (viscosup) 10 mg/mL(mw 2.4 -3.6 million)  Outcome: tolerated well, no immediate complications  Procedure, treatment alternatives, risks and benefits explained, specific risks discussed. Consent was given by the patient. Immediately prior to procedure a time out was called to verify the correct patient, procedure, equipment, support staff and site/side marked as required. Patient was prepped and draped in the usual sterile fashion.     Medical Care Team Attestation: All ProcDoc orders were read back and verbally confirmed with the procedure provider, including but not limited to patient name, medication name, dose, and route, before any actions were taken.  Provider Attestation: The information documented by members of my medical care team was reviewed and verified for accuracy by me.        Assessment & Plan: euflexxa 3 given. RTC as needed if pain worsens.         [1]   Social History  Socioeconomic History    Marital status: Divorced   Tobacco Use    Smoking status: Never    Smokeless tobacco: Never    Tobacco comments:     Vape occasionally    Vaping Use    Vaping status: Some Days    Substances: Nicotine, Flavoring   Substance and Sexual Activity    Alcohol use: Yes     Alcohol/week: 1.0 standard drink of alcohol     Types: 1 Glasses of wine per week     Comment: socially  Drug use: Never    Sexual activity: Yes     Partners: Male     Social Drivers of Health     Tobacco Use: Low Risk (06/06/2024)    Patient History     Smoking Tobacco Use: Never     Smokeless Tobacco Use: Never   Health Literacy: Low Risk (10/27/2021)    Health Literacy     : Never   [2]   Current Outpatient Medications:     ascorbic acid, vitamin C, (VITAMIN C) 1000 MG tablet, Take 2 tablets (2,000 mg total) by mouth in the morning., Disp: , Rfl:     bictegrav-emtricit-tenofov ala (BIKTARVY ) 50-200-25 mg tablet, Take 1 tablet by mouth daily., Disp: 30 tablet, Rfl: 11    bictegrav-emtricit-tenofov ala (BIKTARVY ) 50-200-25 mg tablet, Take 1 tablet by mouth daily., Disp: 30 tablet, Rfl: 11    calcium carbonate (OS-CAL) 1,500 mg (600 mg elem calcium) tablet, Take 1 tablet (600 mg elem calcium total) by mouth in the morning., Disp: , Rfl:     celecoxib  (CELEBREX ) 200 MG capsule, Take 1 capsule (200 mg total) by mouth daily., Disp: 30 capsule, Rfl: 2    cyclobenzaprine (FLEXERIL) 10 MG tablet, , Disp: , Rfl:     erythromycin  (ROMYCIN) 5 mg/gram (0.5 %) ophthalmic ointment, Administer into the left eye Three (3) times a day. Use for 7 days, Disp: 3.5 g, Rfl: 0    escitalopram  oxalate (LEXAPRO ) 20 MG tablet, Take 1 tablet (20 mg total) by mouth daily., Disp: 90 tablet, Rfl: 1    gabapentin  (NEURONTIN ) 300 MG capsule, Take 1 capsule (300 mg total) by mouth nightly., Disp: 90 capsule, Rfl: 0    hydrOXYzine (ATARAX) 25 MG tablet, , Disp: , Rfl:     ibuprofen (MOTRIN) 800 MG tablet, , Disp: , Rfl:     metFORMIN  (GLUCOPHAGE -XR) 750 MG 24 hr tablet, Take 1 tablet (750 mg total) by mouth daily before breakfast., Disp: 90 tablet, Rfl: 0    methylPREDNISolone (MEDROL DOSEPACK) 4 mg tablet, , Disp: , Rfl:     naltrexone HCl (NALTREXONE ORAL), Take 0.1 mg by mouth. (Patient not taking: Reported on 03/23/2024), Disp: , Rfl:     ondansetron  (ZOFRAN -ODT) 4 MG disintegrating tablet, , Disp: , Rfl:     peg 3350-sod sulf,chlr-pot-mag (SUFLAVE ) 178.7-7.3-0.5 gram SolR, Take 3,550 Containers by mouth Take as directed., Disp: 1 each, Rfl: 0    sod sulf-pot chloride-mag sulf (SUTAB ) 1.479-0.188- 0.225 gram Tab, NO Prior Auth needed w/ card.Please run UNIVERSAL COPAY CARD. Run card as cash for cash pay patients and for commercial, run as secondary COB.BIN [id number], PCN 2001,GROUP [id number],MEMBER ID [id number].  PT TO TAKE AS DIRECTED BY GI., Disp: 24 tablet, Rfl: 0    tirzepatide  (ZEPBOUND ) 5 mg/0.5 mL injection pen, Inject 5 mg under the skin every seven (7) days for 4 doses., Disp: 2 mL, Rfl: 0
